# Patient Record
Sex: Male | Born: 1950 | Race: Black or African American | Hispanic: No | Marital: Married | Smoking: Former smoker
Health system: Southern US, Community
[De-identification: ages and names within clinical notes are randomized; demographics above are authoritative.]

## PROBLEM LIST (undated history)

## (undated) DIAGNOSIS — D696 Thrombocytopenia, unspecified: Secondary | ICD-10-CM

## (undated) DIAGNOSIS — I351 Nonrheumatic aortic (valve) insufficiency: Secondary | ICD-10-CM

## (undated) DIAGNOSIS — I10 Essential (primary) hypertension: Secondary | ICD-10-CM

## (undated) DIAGNOSIS — K219 Gastro-esophageal reflux disease without esophagitis: Secondary | ICD-10-CM

## (undated) HISTORY — PX: OTHER SURGICAL HISTORY: SHX169

## (undated) HISTORY — DX: Nonrheumatic aortic (valve) insufficiency: I35.1

## (undated) HISTORY — DX: Thrombocytopenia, unspecified: D69.6

## (undated) HISTORY — PX: HERNIA REPAIR: SHX51

---

## 2012-03-14 ENCOUNTER — Encounter (HOSPITAL_COMMUNITY): Payer: Self-pay | Admitting: *Deleted

## 2012-03-14 ENCOUNTER — Other Ambulatory Visit: Payer: Self-pay

## 2012-03-14 ENCOUNTER — Emergency Department (HOSPITAL_COMMUNITY): Payer: Self-pay

## 2012-03-14 ENCOUNTER — Ambulatory Visit (HOSPITAL_COMMUNITY): Admit: 2012-03-14 | Payer: Self-pay | Admitting: Cardiology

## 2012-03-14 ENCOUNTER — Inpatient Hospital Stay (HOSPITAL_COMMUNITY)
Admission: EM | Admit: 2012-03-14 | Discharge: 2012-03-15 | DRG: 313 | Disposition: A | Discharge status: Home / Self Care | Payer: 59 | Attending: Cardiology | Admitting: Cardiology

## 2012-03-14 ENCOUNTER — Encounter (HOSPITAL_COMMUNITY)
Admission: EM | Disposition: A | Discharge status: Home / Self Care | Payer: Self-pay | Source: Home / Self Care | Attending: Cardiology

## 2012-03-14 DIAGNOSIS — R002 Palpitations: Secondary | ICD-10-CM | POA: Diagnosis present

## 2012-03-14 DIAGNOSIS — R0789 Other chest pain: Principal | ICD-10-CM | POA: Diagnosis present

## 2012-03-14 DIAGNOSIS — K219 Gastro-esophageal reflux disease without esophagitis: Secondary | ICD-10-CM | POA: Diagnosis present

## 2012-03-14 DIAGNOSIS — Z7982 Long term (current) use of aspirin: Secondary | ICD-10-CM

## 2012-03-14 DIAGNOSIS — Z87891 Personal history of nicotine dependence: Secondary | ICD-10-CM

## 2012-03-14 DIAGNOSIS — Z79899 Other long term (current) drug therapy: Secondary | ICD-10-CM

## 2012-03-14 DIAGNOSIS — I10 Essential (primary) hypertension: Secondary | ICD-10-CM | POA: Diagnosis present

## 2012-03-14 DIAGNOSIS — F430 Acute stress reaction: Secondary | ICD-10-CM | POA: Diagnosis present

## 2012-03-14 DIAGNOSIS — I359 Nonrheumatic aortic valve disorder, unspecified: Secondary | ICD-10-CM | POA: Diagnosis present

## 2012-03-14 DIAGNOSIS — R079 Chest pain, unspecified: Secondary | ICD-10-CM

## 2012-03-14 HISTORY — PX: LEFT HEART CATHETERIZATION WITH CORONARY ANGIOGRAM: SHX5451

## 2012-03-14 HISTORY — DX: Gastro-esophageal reflux disease without esophagitis: K21.9

## 2012-03-14 HISTORY — DX: Essential (primary) hypertension: I10

## 2012-03-14 LAB — POCT I-STAT, CHEM 8
Glucose, Bld: 105 mg/dL — ABNORMAL HIGH (ref 70–99)
HCT: 50 % (ref 39.0–52.0)
Hemoglobin: 17 g/dL (ref 13.0–17.0)
Potassium: 4.2 mEq/L (ref 3.5–5.1)
Sodium: 141 mEq/L (ref 135–145)

## 2012-03-14 LAB — CARDIAC PANEL(CRET KIN+CKTOT+MB+TROPI): Troponin I: <0.3 ng/mL (ref <0.30)

## 2012-03-14 LAB — BASIC METABOLIC PANEL
GFR calc Af Amer: 88 mL/min — ABNORMAL LOW (ref >90)
GFR calc non Af Amer: 76 mL/min — ABNORMAL LOW (ref >90)
Potassium: 4.7 mEq/L (ref 3.5–5.1)
Sodium: 138 mEq/L (ref 135–145)

## 2012-03-14 LAB — POCT I-STAT TROPONIN I

## 2012-03-14 LAB — CBC
MCHC: 33.6 g/dL (ref 30.0–36.0)
RDW: 13.5 % (ref 11.5–15.5)

## 2012-03-14 SURGERY — LEFT HEART CATHETERIZATION WITH CORONARY ANGIOGRAM
Anesthesia: LOCAL

## 2012-03-14 MED ORDER — ASPIRIN 81 MG PO CHEW
324.0000 mg | CHEWABLE_TABLET | Freq: Once | ORAL | Status: AC
  Administered 2012-03-14: 324 mg via ORAL
  Filled 2012-03-14: qty 4

## 2012-03-14 MED ORDER — ONDANSETRON HCL 4 MG/2ML IJ SOLN: 4.0000 mg | Freq: Four times a day (QID) | INTRAMUSCULAR | Status: DC | PRN

## 2012-03-14 MED ORDER — ASPIRIN EC 81 MG PO TBEC
81.0000 mg | DELAYED_RELEASE_TABLET | Freq: Every day | ORAL | Status: DC
  Administered 2012-03-15: 81 mg via ORAL
  Filled 2012-03-14 (×3): qty 1

## 2012-03-14 MED ORDER — HEPARIN (PORCINE) IN NACL 100-0.45 UNIT/ML-% IJ SOLN
1000.0000 [IU]/h | INTRAMUSCULAR | Status: DC
  Administered 2012-03-14: 1000 [IU]/h via INTRAVENOUS
  Filled 2012-03-14: qty 250

## 2012-03-14 MED ORDER — NITROGLYCERIN 0.4 MG SL SUBL: 0.4000 mg | SUBLINGUAL_TABLET | SUBLINGUAL | Status: DC | PRN

## 2012-03-14 MED ORDER — SODIUM CHLORIDE 0.9 % IJ SOLN
3.0000 mL | Freq: Two times a day (BID) | INTRAMUSCULAR | Status: DC
  Administered 2012-03-14: 3 mL via INTRAVENOUS

## 2012-03-14 MED ORDER — HEPARIN (PORCINE) IN NACL 2-0.9 UNIT/ML-% IJ SOLN
INTRAMUSCULAR | Status: AC
  Filled 2012-03-14: qty 2000

## 2012-03-14 MED ORDER — NITROGLYCERIN 0.2 MG/ML ON CALL CATH LAB
INTRAVENOUS | Status: AC
  Filled 2012-03-14: qty 1

## 2012-03-14 MED ORDER — METOPROLOL TARTRATE 25 MG PO TABS
25.0000 mg | ORAL_TABLET | Freq: Two times a day (BID) | ORAL | Status: DC
  Administered 2012-03-14 – 2012-03-15 (×2): 25 mg via ORAL
  Filled 2012-03-14 (×2): qty 1

## 2012-03-14 MED ORDER — HEPARIN BOLUS VIA INFUSION
4000.0000 [IU] | Freq: Once | INTRAVENOUS | Status: AC
  Administered 2012-03-14: 4000 [IU] via INTRAVENOUS

## 2012-03-14 MED ORDER — SODIUM CHLORIDE 0.9 % IV SOLN: 250.0000 mL | INTRAVENOUS | Status: DC | PRN

## 2012-03-14 MED ORDER — SODIUM CHLORIDE 0.9 % IJ SOLN: 3.0000 mL | INTRAMUSCULAR | Status: DC | PRN

## 2012-03-14 MED ORDER — PANTOPRAZOLE SODIUM 40 MG PO TBEC
40.0000 mg | DELAYED_RELEASE_TABLET | Freq: Every day | ORAL | Status: DC
  Administered 2012-03-14 – 2012-03-15 (×2): 40 mg via ORAL
  Filled 2012-03-14 (×2): qty 1

## 2012-03-14 MED ORDER — LIDOCAINE HCL (PF) 1 % IJ SOLN
INTRAMUSCULAR | Status: AC
  Filled 2012-03-14: qty 30

## 2012-03-14 MED ORDER — ACETAMINOPHEN 325 MG PO TABS
650.0000 mg | ORAL_TABLET | ORAL | Status: DC | PRN
  Filled 2012-03-14: qty 2

## 2012-03-14 NOTE — ED Notes (Signed)
Patient is resting comfortably. 

## 2012-03-14 NOTE — ED Notes (Signed)
Family at bedside. 

## 2012-03-14 NOTE — H&P (Signed)
History and Physical  Patient ID: Jessy Oto Patient ID: Makani Seckman MRN: [id number], DOB/AGE: [birth year] 63 y.o. Date of Encounter: 03/14/2012  Primary Physician: No local Primary Cardiologist: none  Chief Complaint:  Chest pain  HPI: Mr. Ishmael is a 61 year old male with no clear history of coronary artery disease. He is from [place]. He speaks [place] and understands some [place]. He can speak a few words. His son-in-law's brother is here to help with translation.  Mr. Misiaszek was hospitalized in [place] [facility] (?[place] in 2012 with a cardiac issue. According to the patient there was some sort of blockage. He denies getting a heart catheterization and has never had a stent or a pacemaker. The blockage may have been in the valve. We have no other information available at this time.  Last p.m. the patient had some palpitations that were associated with chest pain. He had 3 more episodes and came to the hospital after the last one. The episodes are all the same. His heart will go "thump thump thump". He will get some pain with this. He denies shortness of breath, nausea vomiting or diaphoresis. He had an episode last p.m., an episode that woke him in the middle of the night, an episode at 8 AM and the last one about 10 AM. In the emergency room he is resting comfortably. He has had only one other episode of palpitations since he was hospitalized in [place] in 2012.  Past Medical History  Diagnosis Date  . Hypertension      Surgical History:  Past Surgical History  Procedure Date  . Hernia     inguinal and abdominal     I have reviewed the patient's current medications. Prescriptions prior to admission  Medication Sig Dispense Refill  . aspirin EC 81 MG tablet Take 81 mg by mouth daily.      . metoprolol (LOPRESSOR) 50 MG tablet Take 25 mg by mouth 2 (two) times daily.      Marland Kitchen omeprazole (PRILOSEC) 40 MG capsule Take 40 mg by mouth daily.        Allergies: No  Known Allergies  History   Social History  . Marital Status: Married    Spouse Name: [name]    Number of Children: [name]  . Years of Education: [name]   Occupational History  . Retired   Social History Main Topics  . Smoking status: Former Games developer  . Smokeless tobacco: Not on file   Comment: 1997  . Alcohol Use: Not on file  . Drug Use: Not on file  . Sexually Active: Not on file   Other Topics Concern  . Not on file   Social History Narrative   Both parents are deceased, his mother had heart problems but his father did not. No siblings have heart problems. He is a retired Nurse, learning disability.      Family history. - Mother died with heart problems  Review of Systems: He has occasional reflux symptoms and takes omeprazole for them. He denies melena. He gets a little bit of bleeding when he brushes his teeth. He has occasional musculoskeletal aches and pains. He has had no recent illnesses, fevers or chills. He has not had any recent GI symptoms. Full 14-point review of systems otherwise negative except as noted above.   Physical Exam: Blood pressure 125/84, pulse 63, temperature 98.2 F (36.8 C), temperature source Oral, resp. rate 20, SpO2 100.00%. General: Well developed, well nourished, African male in no acute distress. Head:  Normocephalic, atraumatic, sclera non-icteric, no xanthomas, nares are without discharge. Dentition: good Neck: No carotid bruits. JVD not elevated. No thyromegally Lungs: Good expansion bilaterally, Clear bilaterally to auscultation without wheezes or rhonchi. No Rales Heart: Regular rate and rhythm with S1 S2. No S3 or S4.  No murmurs, no rubs, or gallops appreciated but there is a lot of ambient noise. Abdomen: Soft, non-tender, non-distended with normoactive bowel sounds. No hepatomegaly. No rebound/guarding. No obvious abdominal masses. Msk:  Strength and tone appear normal for age. No joint deformities or effusions, no spine or costo-vertebral angle  tenderness. Extremities: No clubbing or cyanosis. No edema.  Distal pedal pulses are 2+ and equal bilaterally. Neuro: Alert and oriented X 3. Moves all extremities spontaneously. No focal deficits noted. Psych:  Responds to questions appropriately with a normal affect. Skin: No rashes or lesions noted - midline surgical scar is well healed  Labs:   Lab Results  Component Value Date   WBC 6.8 03/14/2012   HGB 17.0 03/14/2012   HCT 50.0 03/14/2012   MCV 81.8 03/14/2012   PLT 149* 03/14/2012     Lab 03/14/12 1510  NA 141  K 4.2  CL 103  CO2 --  BUN 19  CREATININE 1.20  CALCIUM --  PROT --  BILITOT --  ALKPHOS --  ALT --  AST --  GLUCOSE 105*   Lab Results  Component Value Date   POC TROPNIN 0.00    TROPONINI <0.30 03/14/2012    Radiology/Studies:  pending    EKG: sinus rhythm, early repol on recheck, not a STEMI  ASSESSMENT AND PLAN:  1. chest pain: His symptoms are atypical and associated with palpitations. He will be admitted. We will add heparin. We will check an echocardiogram. He will be continued on telemetry overnight. If his echocardiogram is without significant abnormalities and his labs including cardiac enzymes are within normal limits, consideration can be given to a Myoview  Signed,  Theodore Demark PA-C 03/14/2012, 3:26 PM   Patient seen with PA, agree with the above history and physical.  There is a language barrier: patient is from [place] and speaks only [place].  He has a history of HTN.  Patient's ECG is most likely an early repolarization pattern.  I do not think this is a STEMI.  He had very atypical chest pain (chest tightness associated with palpitations lasting for only seconds at a time).  The symptoms occurred 3 times today, each time lasting < 30 seconds.  Point of care troponin was normal.  No prior CAD, but he apparently was told by a doctor in [place] that something was wrong with a heart valve.  I cannot hear a murmur on exam (limited exam as we are in the  ER and there is considerable ambient noise).  - Cycle cardiac enzymes: if rules out, myoview in am.  - Though pain is atypical, would put him on heparin gtt for now given language barrier, making history difficult.  - Continue home metoprolol - Would try to get an echo done today to look at valves.   - Monitor on telemetry for arrhythmias.   Marca Ancona 03/14/2012 3:32 PM

## 2012-03-14 NOTE — ED Notes (Signed)
Code STEMI canceled.  

## 2012-03-14 NOTE — ED Notes (Signed)
Pt arrived to room, placed in a gown & placed on cardiac monitor, family at bedside, Dr. Marca Ancona, Cardiologist & Romeo Rabon, PA @ bedside, will continue to monitor

## 2012-03-14 NOTE — ED Notes (Signed)
3710-01 Ready 

## 2012-03-14 NOTE — ED Notes (Signed)
To ed for eval of intermittent feeling of heart racing since yesterday. No sob or cp. These palpitations have happened before last year. He was then hospitalized in [place]. States they then changed his cardiac meds. Skin w/d, resp e/u. Appears in nad

## 2012-03-14 NOTE — ED Notes (Signed)
After reviewing pt's EKG, Dr Hyman Hopes came up to triage to interview pt. Pt continues to deny cp

## 2012-03-14 NOTE — ED Provider Notes (Signed)
History     CSN: [id number]  Arrival date & time 03/14/12  1405   None     Chief Complaint  Patient presents with  . Palpitations    (Consider location/radiation/quality/duration/timing/severity/associated sxs/prior treatment) HPI  61yoM h/o CAD, HTN pw intermittent chest pain. Pt c/o several episodes of sharp chest pain last night and this morning. Describes as sharp and stabbing without radiation. Last episode over one hour ago. Each lasting seconds. Unable to tell me whether or not it is worse with exertion. Denies N/V/diaphoresis, shortness of breath. +palpitations.   ED Notes, ED Provider Notes from 03/14/12 0000 to 03/14/12 14:47:18       Janese Banks, RN 03/14/2012 14:41      After reviewing pt's EKG, Dr Hyman Hopes came up to triage to interview pt. Pt continues to deny cp         Graciela Husbands, RN 03/14/2012 14:21      To ed for eval of intermittent feeling of heart racing since yesterday. No sob or cp. These palpitations have happened before last year. He was then hospitalized in [place]. States they then changed his cardiac meds. Skin w/d, resp e/u. Appears in nad    Past Medical History  Diagnosis Date  . Hypertension     Past Surgical History  Procedure Date  . Hernia     inguinal and abdominal    History reviewed. No pertinent family history.  History  Substance Use Topics  . Smoking status: Former Games developer  . Smokeless tobacco: Not on file   Comment: 1997  . Alcohol Use: Not on file      Review of Systems  All other systems reviewed and are negative.   except as noted HPI   Allergies  Review of patient's allergies indicates no known allergies.  Home Medications   Current Outpatient Rx  Name Route Sig Dispense Refill  . ASPIRIN EC 81 MG PO TBEC Oral Take 81 mg by mouth daily.    Marland Kitchen METOPROLOL TARTRATE 50 MG PO TABS Oral Take 25 mg by mouth 2 (two) times daily.    Marland Kitchen OMEPRAZOLE 40 MG PO CPDR Oral Take 40 mg by mouth daily.      BP 125/84  Pulse 63   Temp(Src) 98.2 F (36.8 C) (Oral)  Resp 20  SpO2 100%  Physical Exam  Nursing note and vitals reviewed. Constitutional: He is oriented to person, place, and time. He appears well-developed and well-nourished. No distress.  HENT:  Head: Atraumatic.  Mouth/Throat: Oropharynx is clear and moist.  Eyes: Conjunctivae are normal. Pupils are equal, round, and reactive to light.  Neck: Neck supple.  Cardiovascular: Normal rate, regular rhythm, normal heart sounds and intact distal pulses.  Exam reveals no gallop and no friction rub.   No murmur heard. Pulmonary/Chest: Effort normal. No respiratory distress. He has no wheezes. He has no rales.  Abdominal: Soft. Bowel sounds are normal. There is no tenderness. There is no rebound and no guarding.  Musculoskeletal: Normal range of motion. He exhibits no edema and no tenderness.  Neurological: He is alert and oriented to person, place, and time.  Skin: Skin is warm and dry.  Psychiatric: He has a normal mood and affect.     Date: 03/14/2012  Rate: 61  Rhythm: sinus bradycardia  QRS Axis: normal  Intervals: normal  ST/T Wave abnormalities: STE I, aVL, V1. min ST depression and t wave inversion III  Conduction Disutrbances:none  Narrative Interpretation:   Old EKG Reviewed:  none available   ED Course  Procedures (including critical care time)  Labs Reviewed  CBC - Abnormal; Notable for the following:    Platelets 149 (*)    All other components within normal limits  BASIC METABOLIC PANEL - Abnormal; Notable for the following:    Glucose, Bld 102 (*)    GFR calc non Af Amer 76 (*)    GFR calc Af Amer 88 (*)    All other components within normal limits  POCT I-STAT, CHEM 8 - Abnormal; Notable for the following:    Glucose, Bld 105 (*)    All other components within normal limits  TROPONIN I  POCT I-STAT TROPONIN I   Dg Chest 2 View  03/14/2012  *RADIOLOGY REPORT*  Clinical Data: Ex-smoker with chest pain and palpitations.  CHEST -  2 VIEW  Comparison: None.  Findings: The heart size and mediastinal contours are normal. The lungs are clear. There is no pleural effusion or pneumothorax. No acute osseous findings are identified. A compression deformity near the thoracolumbar junction on the lateral view has a nonacute appearance.  IMPRESSION: No active cardiopulmonary process. Probable old fracture near the thoracolumbar junction.  Original Report Authenticated By: Gerrianne Scale, M.D.     1. Chest pain     MDM  Atypical chest pain with ischemic appearing EKG.   EKGs faxed to cardiology on STEMI call-- recommend 3rd EKG and acute appearing- CODE STEMI. EKG reviewed by DR. Ross  CODE STEMI activated, reversed by [facility] cardiology given atypical story.  Will be admitted to cardiology for formal cardiac evaluation.      Forbes Cellar, MD 03/16/12 [phone]

## 2012-03-14 NOTE — Progress Notes (Signed)
ANTICOAGULATION CONSULT NOTE - Initial Consult  Pharmacy Consult for Heparin Indication: USAP  No Known Allergies  Patient Measurements:    Vital Signs: Temp: 97.3 F (36.3 C) (04/08 1900) Temp src: Oral (04/08 1900) BP: 121/81 mmHg (04/08 1900) Pulse Rate: 62  (04/08 1900)  Labs:  Basename 03/14/12 1510 03/14/12 1503  HGB 17.0 15.1  HCT 50.0 45.0  PLT -- 149*  APTT -- --  LABPROT -- --  INR -- --  HEPARINUNFRC -- --  CREATININE 1.20 1.02  CKTOTAL -- --  CKMB -- --  TROPONINI -- <0.30   CrCl is unknown because there is no height on file for the current visit.  Medical History: Past Medical History  Diagnosis Date  . Hypertension   . Chest pain 03/14/2012  . GERD (gastroesophageal reflux disease)   . Diabetes mellitus     borderline diabetic controled by diet     Medications:   (Not in a hospital admission)  Assessment: 61 y/o male patient admitted with chest pain requiring anticoagulation for r/o MI. Cardiac enzymes negative x1 and EKG wnl. Plan for echo in am.  Goal of Therapy:  Heparin level 0.3-0.7 units/ml   Plan:  Heparin 4000 unit IV bolus followed by infusion at 1000 units/hr. Check 6 hour heparin level with daily cbc and heparin level.  Verlene Mayer, PharmD, BCPS Pager [phone] 03/14/2012,8:19 PM

## 2012-03-15 ENCOUNTER — Other Ambulatory Visit: Payer: Self-pay

## 2012-03-15 ENCOUNTER — Inpatient Hospital Stay (HOSPITAL_COMMUNITY): Payer: Self-pay

## 2012-03-15 ENCOUNTER — Encounter (HOSPITAL_COMMUNITY): Payer: Self-pay | Admitting: Physician Assistant

## 2012-03-15 DIAGNOSIS — R072 Precordial pain: Secondary | ICD-10-CM

## 2012-03-15 DIAGNOSIS — R079 Chest pain, unspecified: Secondary | ICD-10-CM

## 2012-03-15 LAB — CARDIAC PANEL(CRET KIN+CKTOT+MB+TROPI)
CK, MB: 2.9 ng/mL (ref 0.3–4.0)
CK, MB: 2.6 ng/mL (ref 0.3–4.0)
Relative Index: 1 (ref 0.0–2.5)
Troponin I: <0.3 ng/mL (ref <0.30)

## 2012-03-15 LAB — BASIC METABOLIC PANEL
BUN: 14 mg/dL (ref 6–23)
Chloride: 105 mEq/L (ref 96–112)
Creatinine, Ser: 1.01 mg/dL (ref 0.50–1.35)
GFR calc non Af Amer: 77 mL/min — ABNORMAL LOW (ref >90)
Glucose, Bld: 102 mg/dL — ABNORMAL HIGH (ref 70–99)
Potassium: 4.4 mEq/L (ref 3.5–5.1)

## 2012-03-15 LAB — HEPARIN LEVEL (UNFRACTIONATED)
Heparin Unfractionated: 0.93 IU/mL — ABNORMAL HIGH (ref 0.30–0.70)
Heparin Unfractionated: 0.5 IU/mL (ref 0.30–0.70)

## 2012-03-15 LAB — CBC
HCT: 44.8 % (ref 39.0–52.0)
Hemoglobin: 14.3 g/dL (ref 13.0–17.0)
MCH: 26.6 pg (ref 26.0–34.0)
MCHC: 31.9 g/dL (ref 30.0–36.0)
RDW: 13.7 % (ref 11.5–15.5)

## 2012-03-15 LAB — LIPID PANEL
Cholesterol: 166 mg/dL (ref 0–200)
HDL: 49 mg/dL (ref >39)

## 2012-03-15 MED ORDER — REGADENOSON 0.4 MG/5ML IV SOLN
0.4000 mg | Freq: Once | INTRAVENOUS | Status: AC
  Administered 2012-03-15: 0.4 mg via INTRAVENOUS
  Filled 2012-03-15: qty 5

## 2012-03-15 MED ORDER — HEPARIN (PORCINE) IN NACL 100-0.45 UNIT/ML-% IJ SOLN
850.0000 [IU]/h | INTRAMUSCULAR | Status: DC
  Filled 2012-03-15: qty 250

## 2012-03-15 MED ORDER — TECHNETIUM TC 99M TETROFOSMIN IV KIT
10.0000 | PACK | Freq: Once | INTRAVENOUS | Status: AC | PRN
  Administered 2012-03-15: 10 via INTRAVENOUS

## 2012-03-15 MED ORDER — TECHNETIUM TC 99M TETROFOSMIN IV KIT
30.0000 | PACK | Freq: Once | INTRAVENOUS | Status: AC | PRN
  Administered 2012-03-15: 30 via INTRAVENOUS

## 2012-03-15 NOTE — Progress Notes (Signed)
ANTICOAGULATION CONSULT NOTE - Follow Up  Pharmacy Consult for Heparin Indication: USAP  No Known Allergies  Patient Measurements: Height: 5\' 10"  (177.8 cm) Weight: 172 lb 2.9 oz (78.1 kg) IBW/kg (Calculated) : 73  Heparin dosing weight = 78 kg  Vital Signs: Temp: 98.6 F (37 C) (04/08 2150) Temp src: Oral (04/08 1900) BP: 147/83 mmHg (04/08 2150) Pulse Rate: 57  (04/08 2150)  Labs:  Basename 03/15/12 0443 03/14/12 2203 03/14/12 1510 03/14/12 1503  HGB 14.3 -- 17.0 --  HCT 44.8 -- 50.0 45.0  PLT 130* -- -- 149*  APTT -- -- -- --  LABPROT -- -- -- --  INR -- -- -- --  HEPARINUNFRC 0.93* -- -- --  CREATININE -- -- 1.20 1.02  CKTOTAL -- 287* -- --  CKMB -- 3.2 -- --  TROPONINI -- <0.30 -- <0.30   Estimated Creatinine Clearance: 65.1 ml/min (by C-G formula based on Cr of 1.2).  Medical History: Past Medical History  Diagnosis Date  . Hypertension   . Chest pain 03/14/2012  . GERD (gastroesophageal reflux disease)   . Diabetes mellitus     borderline diabetic controled by diet     Medications:  Prescriptions prior to admission  Medication Sig Dispense Refill  . aspirin EC 81 MG tablet Take 81 mg by mouth daily.      . metoprolol (LOPRESSOR) 50 MG tablet Take 25 mg by mouth 2 (two) times daily.      Marland Kitchen omeprazole (PRILOSEC) 40 MG capsule Take 40 mg by mouth daily.        Assessment: 61 y/o male patient admitted with chest pain requiring anticoagulation for r/o MI. Cardiac enzymes negative x1 and EKG wnl. Plan for echo in AM. Heparin level (0.93) is above-goal on 1000 units/hr. Confirmed with RN heparin level drawn from arm opposite heparin infusion.   Goal of Therapy:  Heparin level 0.3-0.7 units/ml   Plan:  1. Hold heparin x 1 hour (1610-9604).  2. Restart IV heparin at 850 units/hr at 0630.  3. Heparin level in 6 hours after restart (1230).   Lorre Munroe, PharmD. 03/15/2012,5:27 AM

## 2012-03-15 NOTE — Discharge Summary (Signed)
Discharge Summary   Patient ID: John Hayden MRN: [id number], DOB/AGE: [birth year] 61 y.o.  Primary MD: None Primary Cardiologist: New Admit date: 03/14/2012 D/C date:     03/15/2012      Primary Discharge Diagnoses:  1. Chest pain, atypical   - normal Lexiscan myoview, echo with normal EF 03/15/12 2. Mild aortic regurgitation by echo 03/15/12 3. Mildly decreased platelet count  Secondary Discharge Diagnoses:  1. HTN 2. GERD 3. Borderline DM controlled by diet  History of Present Illness: 61 y.o. male from [place] w/ HTN, but no clear h/o CAD who presented to [facility] on 03/14/12 with complaints of chest pain. Mr. Olveda was hospitalized in [place] [facility] [facility] in 2012 with a cardiac issue. According to the patient, there was some sort of blockage. He denied heart cath and has never had a stent or pacemaker. The blockage may have been in the valve, although no other info was available at time of cardiology evaluation in ER. He presented with some palpitations that were associated with chest pain where his heart will go "thump thump thump." He had 3 more episodes and came to the hospital after the last one. He only had one other episode of palpitations since hospitalization in [place] in 2012. In the ED, EKG revealed NSR with no significant ST changes. CXR was without acute cardiopulmonary abnormalities. Troponin was negative initially. He was admitted for further evaluation and treatment. His symptoms were felt atypical but in light of unclear history and language barrier he was initiated on IV heparin while he ruled out. Cardiac enzymes were cycled and remained negative. He underwent Lexiscan myoview stress testing which was normal with EF 59%. 2D echo was also obtained demonstrating EF 50-55% & mild AR. He was seen and evaluated by Dr. Johney Frame who felt he was stable for discharge home with plans for follow up as scheduled below. The patient had a borderline low platelet count  and I also conveyed these results to him and his family (his family aided in translation). He plans to return to [place] in 20 days.  Discharge Vitals: Blood pressure 128/74, pulse 49, temperature 98.4 F (36.9 C), temperature source Oral, resp. rate 20, height 5\' 10"  (1.778 m), weight 172 lb 2.9 oz (78.1 kg), SpO2 99.00%.  Labs: Lab Results  Component Value Date   WBC 5.6 03/15/2012   HGB 14.3 03/15/2012   HCT 44.8 03/15/2012   MCV 83.3 03/15/2012   PLT 130* 03/15/2012     Lab 03/15/12 0443  NA 138  K 4.4  CL 105  CO2 26  BUN 14  CREATININE 1.01  CALCIUM 8.9  PROT --  BILITOT --  ALKPHOS --  ALT --  AST --  GLUCOSE 102*    Basename 03/15/12 1325 03/15/12 0443 03/14/12 2203 03/14/12 1503  CKTOTAL 293* 289* 287* --  CKMB 2.6 2.9 3.2 --  TROPONINI <0.30 <0.30 <0.30 <0.30   Lab Results  Component Value Date   CHOL 166 03/15/2012   HDL 49 03/15/2012   LDLCALC 409* 03/15/2012   TRIG 52 03/15/2012   Studies: 03/15/12 - Lexiscan Myoview - IMPRESSION: Normal myocardial perfusion study with left ventricular ejection fraction of 59%.   03/15/12 - 2D Echocardiogram Study Conclusions - Left ventricle: The cavity size was normal. Wall thickness was normal. Systolic function was normal. The estimated ejection fraction was in the range of 50% to 55%. - Aortic valve: Mild regurgitation. - Atrial septum: No defect or patent foramen ovale  was identified.   Discharge Medications   Medication List  As of 03/15/2012  5:22 PM   TAKE these medications         aspirin EC 81 MG tablet   Take 81 mg by mouth daily.      metoprolol 50 MG tablet   Commonly known as: LOPRESSOR   Take 25 mg by mouth 2 (two) times daily.      omeprazole 40 MG capsule   Commonly known as: PRILOSEC   Take 40 mg by mouth daily.            Disposition   Discharge Orders    Future Orders Please Complete By Expires   Diet - low sodium heart healthy      Increase activity slowly        Follow-up Information     Follow up with Tereso Newcomer, PA. (Our office will call you for a follow-up appointment)    Contact information:   [address] [place] [address] [phone]       Follow up with Primary Medical Doctor. (Your blood platelet count was very mildly low. Please follow up with your primary doctor as you may need to have this rechecked to make sure it is stable.)            Duration of Discharge Encounter: Greater than 30 minutes including physician and PA time.  Signed, Ronie Spies PA-C 03/15/2012, 5:22 PM   I have seen, examined the patient, and reviewed the above assessment and plan.   Co Sign: Hillis Range, MD 03/16/2012 12:16 PM

## 2012-03-15 NOTE — Progress Notes (Signed)
  Echocardiogram 2D Echocardiogram has been performed.  John Hayden A 03/15/2012, 2:31 PM

## 2012-03-15 NOTE — Progress Notes (Signed)
Pts family at bedside and reviewed d/c instructions with him who spoke [place]. Assessment unchanged from this am and pt discharged to private vehicle via wheelchair

## 2012-03-15 NOTE — Progress Notes (Signed)
Cardiology Progress Note Patient Name: John Hayden Date of Encounter: 03/15/2012, 10:10 AM     Subjective  Patient seen briefly in nuclear stress room.   Pre Test: VSS. EKG shows SB 53bpm, no acute ST/T changes. Pt denies chest pain or sob.  During Test: VSS. EKG with ST flattening inferolaterally, TWI V3-4. Pt denies chest pain or sob.  Post Test: VSS. EKG returned to baseline. Pt without complaints.  Await interpretation of images.    Objective   Telemetry: Unable to assess as pt was seen in nuc med  Medications: . aspirin  324 mg Oral Once  . aspirin EC  81 mg Oral Daily  . heparin  4,000 Units Intravenous Once  . metoprolol  25 mg Oral BID  . pantoprazole  40 mg Oral Q1200  . sodium chloride  3 mL Intravenous Q12H   . heparin 850 Units/hr (03/15/12 0636)    Physical Exam: Temp:  [97.3 F (36.3 C)-98.6 F (37 C)] 98.1 F (36.7 C) (04/09 0600) Pulse Rate:  [54-63] 54  (04/09 0600) Resp:  [19-20] 20  (04/09 0600) BP: (121-147)/(81-88) 143/88 mmHg (04/09 0600) SpO2:  [99 %-100 %] 99 % (04/09 0600) Weight:  [169 lb 12.1 oz (77.001 kg)-172 lb 2.9 oz (78.1 kg)] 172 lb 2.9 oz (78.1 kg) (04/08 2150)  General: Pleasant black male, in no acute distress. Head: Normocephalic, atraumatic, sclera non-icteric, nares are without discharge.  Neck: Supple. Negative for carotid bruits or JVD Lungs: Clear bilaterally to auscultation without wheezes, rales, or rhonchi. Breathing is unlabored. Heart: RRR S1 S2 without murmurs, rubs, or gallops.  Abdomen: Soft, non-tender, non-distended with normoactive bowel sounds. No rebound/guarding. No obvious abdominal masses. Msk:  Strength and tone appear normal for age. Extremities: Trace bilat ankle edema. No clubbing or cyanosis. Distal pedal pulses are intact and equal bilaterally. Neuro: Alert and oriented X 3. Moves all extremities spontaneously. Psych:  Responds to questions appropriately with a normal  affect.   Intake/Output Summary (Last 24 hours) at 03/15/12 1010 Last data filed at 03/15/12 0900  Gross per 24 hour  Intake      0 ml  Output      0 ml  Net      0 ml    Labs:  Basename 03/15/12 0443 03/14/12 1510 03/14/12 1503  NA 138 141 --  K 4.4 4.2 --  CL 105 103 --  CO2 26 -- 25  GLUCOSE 102* 105* --  BUN 14 19 --  CREATININE 1.01 1.20 --  CALCIUM 8.9 -- 9.5   Basename 03/15/12 0443 03/14/12 1510 03/14/12 1503  WBC 5.6 -- 6.8  HGB 14.3 17.0 --  HCT 44.8 50.0 --  MCV 83.3 -- 81.8  PLT 130* -- 149*   Basename 03/15/12 0443 03/14/12 2203 03/14/12 1503  CKTOTAL 289* 287* --  CKMB 2.9 3.2 --  TROPONINI <0.30 <0.30 <0.30   Basename 03/15/12 0443  CHOL 166  HDL 49  LDLCALC 107*  TRIG 52  CHOLHDL 3.4    Radiology/Studies:    03/14/2012 - CXR  Findings: The heart size and mediastinal contours are normal. The lungs are clear. There is no pleural effusion or pneumothorax. No acute osseous findings are identified. A compression deformity near the thoracolumbar junction on the lateral view has a nonacute appearance.  IMPRESSION: No active cardiopulmonary process. Probable old fracture near the thoracolumbar junction.     Assessment and Plan  61 y.o. male from [place] w/ HTN, but  no clear h/o CAD who presented to [facility] on 03/14/12 with complaints of chest pain.  1. Chest Pain: Atypical symptoms. EKG nonacute. Cardiac enzymes negative x3 (elevated total CK). Echo pending. For myoview this morning. Cont Heparin, ASA, BB. Further plans pending myoview results. LDL 107, consider addition of statin if ischemia on myoview.  Signed, HOPE, JESSICA PA-C   I have seen, examined the patient, and reviewed the above assessment and plan.  Pt without chest pain today.  Exam as above.  I would anticipate discharge later today if myoview is low risk.  Stop heparin gtt.  If myoview is high risk, would consider cath in am.  Otherwise, echo can be done in the office.  He should  follow-up with Tereso Newcomer in 2 weeks.   Co Sign: Hillis Range, MD 03/15/2012 3:55 PM

## 2012-03-15 NOTE — Progress Notes (Signed)
ANTICOAGULATION CONSULT NOTE - Follow Up  Pharmacy Consult for Heparin Indication: USAP  No Known Allergies  Patient Measurements: Height: 5\' 10"  (177.8 cm) Weight: 172 lb 2.9 oz (78.1 kg) IBW/kg (Calculated) : 73  Heparin dosing weight = 78 kg  Vital Signs: Temp: 98.1 F (36.7 C) (04/09 0600) BP: 127/80 mmHg (04/09 1046) Pulse Rate: 76  (04/09 1046)  Labs:  Basename 03/15/12 1325 03/15/12 0443 03/14/12 2203 03/14/12 1510 03/14/12 1503  HGB -- 14.3 -- 17.0 --  HCT -- 44.8 -- 50.0 45.0  PLT -- 130* -- -- 149*  APTT -- -- -- -- --  LABPROT -- -- -- -- --  INR -- -- -- -- --  HEPARINUNFRC 0.50 0.93* -- -- --  CREATININE -- 1.01 -- 1.20 1.02  CKTOTAL -- 289* 287* -- --  CKMB -- 2.9 3.2 -- --  TROPONINI -- <0.30 <0.30 -- <0.30   Estimated Creatinine Clearance: 77.3 ml/min (by C-G formula based on Cr of 1.01).  Medical History: Past Medical History  Diagnosis Date  . Hypertension   . Chest pain 03/14/2012  . GERD (gastroesophageal reflux disease)   . Diabetes mellitus     borderline diabetic controled by diet     Medications:  Prescriptions prior to admission  Medication Sig Dispense Refill  . aspirin EC 81 MG tablet Take 81 mg by mouth daily.      . metoprolol (LOPRESSOR) 50 MG tablet Take 25 mg by mouth 2 (two) times daily.      Marland Kitchen omeprazole (PRILOSEC) 40 MG capsule Take 40 mg by mouth daily.        Assessment: Heparin level now therapeutic at 0.50 Myoview images pending.   Goal of Therapy:  Heparin level 0.3-0.7 units/ml   Plan:  1. Continue heparin at 850 units /hr 2. Follow up AM heparin level  Thank you.  Okey Regal, PharmD Clinical Pharmacist [phone] 03/15/2012,2:15 PM

## 2012-03-15 NOTE — Progress Notes (Signed)
Clinical Social Work Department BRIEF PSYCHOSOCIAL ASSESSMENT 03/15/2012  Patient:  DWIGHT, ADAMCZAK     Account Number:  [id number]     Admit date:  03/14/2012  Clinical Social Worker:  Hulan Fray  Date/Time:  03/15/2012 12:53 PM  Referred by:  RN  Date Referred:  03/15/2012 Referred for  Other - See comment   Other Referral:   Financial assistance wih hospital bill   Interview type:  Family Other interview type:    PSYCHOSOCIAL DATA Living Status:  WIFE Admitted from facility:   Level of care:   Primary support name:  Lradi Primary support relationship to patient:  CHILD, ADULT Degree of support available:   supportive    CURRENT CONCERNS Current Concerns  Financial Resources   Other Concerns:    SOCIAL WORK ASSESSMENT / PLAN Clinical Social Worker received call from RN that this patient is requesting assistance with hospital bill. Patient does not speak english that well, but daughter was in room and was able to voice current concern of medical bill. CSW offered to call financial counselor to assist with hospital bill. CSW called financial counselor, Maddie to assist with hospital bill. CSW informed daughter that the financial counselor can assist with medicaid application, but daughter declined and stated that patient is from out of town and visiting her. CSW will sign off as social work intervention is no longer needed.   Assessment/plan status:  No Further Intervention Required Other assessment/ plan:   Information/referral to community resources:    PATIENT'S/FAMILY'S RESPONSE TO PLAN OF CARE: Family is agreeable to CSW contacting financial counselor for assistance with hospital bill.

## 2012-03-30 ENCOUNTER — Ambulatory Visit (INDEPENDENT_AMBULATORY_CARE_PROVIDER_SITE_OTHER): Payer: Self-pay | Admitting: Physician Assistant

## 2012-03-30 ENCOUNTER — Encounter: Payer: Self-pay | Admitting: Physician Assistant

## 2012-03-30 VITALS — BP 170/100 | HR 65 | Ht 62.0 in | Wt 179.8 lb

## 2012-03-30 DIAGNOSIS — I10 Essential (primary) hypertension: Secondary | ICD-10-CM | POA: Insufficient documentation

## 2012-03-30 DIAGNOSIS — R079 Chest pain, unspecified: Secondary | ICD-10-CM

## 2012-03-30 DIAGNOSIS — I359 Nonrheumatic aortic valve disorder, unspecified: Secondary | ICD-10-CM

## 2012-03-30 DIAGNOSIS — D696 Thrombocytopenia, unspecified: Secondary | ICD-10-CM

## 2012-03-30 DIAGNOSIS — I351 Nonrheumatic aortic (valve) insufficiency: Secondary | ICD-10-CM | POA: Insufficient documentation

## 2012-03-30 MED ORDER — METOPROLOL TARTRATE 50 MG PO TABS: 25.0000 mg | ORAL_TABLET | Freq: Two times a day (BID) | ORAL | Status: DC

## 2012-03-30 NOTE — Progress Notes (Signed)
[address] [place], [place]  [address] Phone: [phone] Fax:  [phone]  Date:  03/30/2012   Name:  John Hayden       DOB:  Jan 17, 1951 MRN:  [id number]  PCP:  Pcp Not In System  Primary Cardiologist:  Dr. Marca Ancona  Primary Electrophysiologist:  None    History of Present Illness: John Hayden is a 61 y.o. male from [place] (speaks only [place]) who presents for post hospital follow up.  He has a h/o HTN and GERD as well as borderline DM.  He reported a h/o admission to [facility], [place], [place] in 2012.  He apparently had some testing on his heart but no apparent LHC or PCI.  He was admitted with CP and palpitations.  He ruled out of MI.  Echo normal aside from mild AI.  Stress test also was normal.  He was asked to follow up with his PCP for low Plt count.  He was set up for follow up with me today.  He is apparently returning to [place] soon.  2D Echocardiogram 03/15/12: Study Conclusions  - Left ventricle: The cavity size was normal. Wall thickness was normal. Systolic function was normal. The estimated ejection fraction was in the range of 50% to 55%. - Aortic valve: Mild regurgitation. - Atrial septum: No defect or patent foramen ovale was identified.  Lexiscan Myoview 03/14/12: IMPRESSION: Normal myocardial perfusion study with left ventricular ejection fraction of 59%.  Here with daughter who interprets.  Patient denies chest pain, dyspnea, syncope, palpitations, edema, headaches.  Forgot to take his medication this AM.  Leaves for [place] next week.  Will run out of metoprolol before he goes back.    Past Medical History  Diagnosis Date  . Hypertension   . GERD (gastroesophageal reflux disease)   . Diabetes mellitus     borderline diabetic controlled by diet   . Chest pain     echo 4/13: EF 50-55%, mild AI;  Myoview 4/13: EF 59%, no ischemia  . Mild aortic insufficiency   . Thrombocytopenia     Current Outpatient  Prescriptions  Medication Sig Dispense Refill  . aspirin EC 81 MG tablet Take 81 mg by mouth daily.      . metoprolol (LOPRESSOR) 50 MG tablet Take 25 mg by mouth 2 (two) times daily.      Marland Kitchen omeprazole (PRILOSEC) 40 MG capsule Take 40 mg by mouth daily.        Allergies: No Known Allergies  History  Substance Use Topics  . Smoking status: Former Games developer  . Smokeless tobacco: Never Used   Comment: 1997  . Alcohol Use: No     PHYSICAL EXAM: VS:  BP 170/100  Pulse 65  Ht 5\' 2"  (1.575 m)  Wt 179 lb 12.8 oz (81.557 kg)  BMI 32.89 kg/m2 Repeat BP by me 142/92  Well nourished, well developed, in no acute distress HEENT: normal Neck: no JVD Cardiac:  normal S1, S2; RRR; no murmur Lungs:  clear to auscultation bilaterally, no wheezing, rhonchi or rales Abd: soft, nontender, no hepatomegaly Ext: no edema Skin: warm and dry Neuro:  CNs 2-12 intact, no focal abnormalities noted  EKG:  Sinus rhythm, heart rate 65, normal axis, J-point elevation, LVH, no change from prior  ASSESSMENT AND PLAN:  1. Essential hypertension, benign  Uncontrolled.  He did not take his medication this am.  He took in the office an BP came down some.  He had very well  controlled BP in the hospital on his current regimen.  No changes.  Refill metoprolol for him.     2. Chest pain  Resolved.  Atypical.  Myoview and Echo ok.  Follow up PRN   3. Mild AI (aortic insufficiency)  Provided him with copies of all hospital records.  He knows to follow up with his physician and [place].  He would likely need follow up echo in 2 years.   4. Thrombocytopenia  Sounds like this is chronic.  Again, gave all his records to him.  He will follow up on this with his PCP in [place].       Signed, Tereso Newcomer, PA-C  10:35 AM 03/30/2012

## 2012-03-30 NOTE — Patient Instructions (Signed)
Your physician recommends that you schedule a follow-up appointment in: AS NEEDED  A REFILL HAS BEEN SENT IN TODAY FOR METOPROLOL

## 2014-11-15 ENCOUNTER — Encounter (HOSPITAL_COMMUNITY): Payer: Self-pay | Admitting: Cardiology

## 2016-02-10 ENCOUNTER — Emergency Department (HOSPITAL_COMMUNITY)
Admission: EM | Admit: 2016-02-10 | Discharge: 2016-02-10 | Disposition: A | Discharge status: Home / Self Care | Payer: Self-pay | Attending: Emergency Medicine | Admitting: Emergency Medicine

## 2016-02-10 ENCOUNTER — Encounter (HOSPITAL_COMMUNITY): Payer: Self-pay

## 2016-02-10 DIAGNOSIS — I10 Essential (primary) hypertension: Secondary | ICD-10-CM | POA: Insufficient documentation

## 2016-02-10 DIAGNOSIS — R1030 Lower abdominal pain, unspecified: Secondary | ICD-10-CM | POA: Insufficient documentation

## 2016-02-10 DIAGNOSIS — Z79899 Other long term (current) drug therapy: Secondary | ICD-10-CM | POA: Insufficient documentation

## 2016-02-10 DIAGNOSIS — Z7984 Long term (current) use of oral hypoglycemic drugs: Secondary | ICD-10-CM | POA: Insufficient documentation

## 2016-02-10 DIAGNOSIS — Z862 Personal history of diseases of the blood and blood-forming organs and certain disorders involving the immune mechanism: Secondary | ICD-10-CM | POA: Insufficient documentation

## 2016-02-10 DIAGNOSIS — K219 Gastro-esophageal reflux disease without esophagitis: Secondary | ICD-10-CM | POA: Insufficient documentation

## 2016-02-10 DIAGNOSIS — R350 Frequency of micturition: Secondary | ICD-10-CM | POA: Insufficient documentation

## 2016-02-10 DIAGNOSIS — Z87891 Personal history of nicotine dependence: Secondary | ICD-10-CM | POA: Insufficient documentation

## 2016-02-10 DIAGNOSIS — Z7982 Long term (current) use of aspirin: Secondary | ICD-10-CM | POA: Insufficient documentation

## 2016-02-10 LAB — URINALYSIS, ROUTINE W REFLEX MICROSCOPIC
Bilirubin Urine: NEGATIVE
Glucose, UA: NEGATIVE mg/dL
Hgb urine dipstick: NEGATIVE
KETONES UR: NEGATIVE mg/dL
LEUKOCYTES UA: NEGATIVE
NITRITE: NEGATIVE
PH: 5 (ref 5.0–8.0)
PROTEIN: NEGATIVE mg/dL
Specific Gravity, Urine: 1.009 (ref 1.005–1.030)

## 2016-02-10 LAB — CBC
HCT: 41.6 % (ref 39.0–52.0)
Hemoglobin: 13.5 g/dL (ref 13.0–17.0)
MCH: 26.6 pg (ref 26.0–34.0)
MCHC: 32.5 g/dL (ref 30.0–36.0)
MCV: 81.9 fL (ref 78.0–100.0)
PLATELETS: 123 10*3/uL — AB (ref 150–400)
RBC: 5.08 MIL/uL (ref 4.22–5.81)
RDW: 13.1 % (ref 11.5–15.5)
WBC: 4.8 10*3/uL (ref 4.0–10.5)

## 2016-02-10 LAB — COMPREHENSIVE METABOLIC PANEL
ALBUMIN: 3.6 g/dL (ref 3.5–5.0)
ALT: 21 U/L (ref 17–63)
ANION GAP: 8 (ref 5–15)
AST: 23 U/L (ref 15–41)
Alkaline Phosphatase: 37 U/L — ABNORMAL LOW (ref 38–126)
BUN: 17 mg/dL (ref 6–20)
CHLORIDE: 104 mmol/L (ref 101–111)
CO2: 30 mmol/L (ref 22–32)
Calcium: 9.6 mg/dL (ref 8.9–10.3)
Creatinine, Ser: 1.17 mg/dL (ref 0.61–1.24)
GFR calc Af Amer: >60 mL/min (ref >60)
GFR calc non Af Amer: >60 mL/min (ref >60)
GLUCOSE: 101 mg/dL — AB (ref 65–99)
POTASSIUM: 3.8 mmol/L (ref 3.5–5.1)
SODIUM: 142 mmol/L (ref 135–145)
Total Bilirubin: 0.4 mg/dL (ref 0.3–1.2)
Total Protein: 6 g/dL — ABNORMAL LOW (ref 6.5–8.1)

## 2016-02-10 LAB — CBG MONITORING, ED: GLUCOSE-CAPILLARY: 183 mg/dL — AB (ref 65–99)

## 2016-02-10 MED ORDER — DOXYCYCLINE HYCLATE 100 MG PO CAPS: 100.0000 mg | ORAL_CAPSULE | Freq: Two times a day (BID) | ORAL | Status: DC

## 2016-02-10 MED ORDER — CEFTRIAXONE SODIUM 250 MG IJ SOLR
250.0000 mg | Freq: Once | INTRAMUSCULAR | Status: AC
  Administered 2016-02-10: 250 mg via INTRAMUSCULAR
  Filled 2016-02-10: qty 250

## 2016-02-10 MED ORDER — CIPROFLOXACIN HCL 500 MG PO TABS: 500.0000 mg | ORAL_TABLET | Freq: Two times a day (BID) | ORAL | Status: DC

## 2016-02-10 NOTE — ED Notes (Signed)
Pt and family verbalize understanding of instructions. 

## 2016-02-10 NOTE — ED Provider Notes (Signed)
CSN: [id number]     Arrival date & time 02/10/16  3086 History   First MD Initiated Contact with Patient 02/10/16 1210     Chief Complaint  Patient presents with  . Abdominal Pain  . Urinary Frequency   HPI   John Hayden is a 65 y.o. male PMH significant for Hypertension, diabetes presenting with a 3 day history of suprapubic pain and "prostate pain". He endorses urinary frequency and difficulty stopping urine flow once it begins. He denies fevers, chills, chest pain, shortness of breath, nausea, vomiting, penile discharge or pain, hematochezia, history of this previously. Patient's daughter is at bedside and is interpreting for patient, as he speaks [place].  Past Medical History  Diagnosis Date  . Hypertension   . GERD (gastroesophageal reflux disease)   . Diabetes mellitus     borderline diabetic controlled by diet   . Chest pain     echo 4/13: EF 50-55%, mild AI;  Myoview 4/13: EF 59%, no ischemia  . Mild aortic insufficiency   . Thrombocytopenia [facility])    Past Surgical History  Procedure Laterality Date  . Hernia      inguinal and abdominal  . Hernia repair    . Left heart catheterization with coronary angiogram [name] 03/14/2012    Procedure: LEFT HEART CATHETERIZATION WITH CORONARY ANGIOGRAM;  Surgeon: Peter M Swaziland, MD;  Location: [facility] CATH LAB;  Service: Cardiovascular;  Laterality: [name];   No family history on file. Social History  Substance Use Topics  . Smoking status: Former Games developer  . Smokeless tobacco: Never Used     Comment: 1997  . Alcohol Use: No    Review of Systems  Ten systems are reviewed and are negative for acute change except as noted in the HPI  Allergies  Review of patient's allergies indicates no known allergies.  Home Medications   Prior to Admission medications   Medication Sig Start Date End Date Taking? Authorizing Provider  aspirin EC 81 MG tablet Take 81 mg by mouth daily.   Yes Historical Provider, MD  metFORMIN (GLUCOPHAGE) 500 MG  tablet Take 500 mg by mouth 2 (two) times daily with a meal.   Yes Historical Provider, MD  PRESCRIPTION MEDICATION Take 4 mg by mouth daily. 1 tablet once daily. ( cardox 4 mg. ( for prostate)   Yes Historical Provider, MD  PRESCRIPTION MEDICATION Take 1 tablet by mouth daily. Atenolol/chlortalidone 50 mg/ 12.5 mg ( tenoric-50)   Yes Historical Provider, MD  metoprolol (LOPRESSOR) 50 MG tablet Take 0.5 tablets (25 mg total) by mouth 2 (two) times daily. 03/30/12   Beatrice Lecher, PA-C  omeprazole (PRILOSEC) 40 MG capsule Take 40 mg by mouth daily.    Historical Provider, MD   BP 126/77 mmHg  Pulse 45  Temp(Src) 98.1 F (36.7 C) (Oral)  Resp 16  SpO2 100% Physical Exam  Constitutional: He appears well-developed and well-nourished. No distress.  HENT:  Head: Normocephalic and atraumatic.  Mouth/Throat: Oropharynx is clear and moist. No oropharyngeal exudate.  Eyes: Conjunctivae are normal. Pupils are equal, round, and reactive to light. Right eye exhibits no discharge. Left eye exhibits no discharge. No scleral icterus.  Neck: No tracheal deviation present.  Cardiovascular: Normal rate, regular rhythm, normal heart sounds and intact distal pulses.  Exam reveals no gallop and no friction rub.   No murmur heard. Pulmonary/Chest: Effort normal and breath sounds normal. No respiratory distress. He has no wheezes. He has no rales. He exhibits no tenderness.  Abdominal:  Soft. Bowel sounds are normal. He exhibits no distension and no mass. There is no tenderness. There is no rebound and no guarding.  Genitourinary: Rectum normal, prostate normal and penis normal. No penile tenderness.  DRE with chaperone: negative without mass, lesions or tenderness.   Musculoskeletal: He exhibits no edema.  Lymphadenopathy:    He has no cervical adenopathy.  Neurological: He is alert. Coordination normal.  Skin: Skin is warm and dry. No rash noted. He is not diaphoretic. No erythema.  Psychiatric: He has a  normal mood and affect. His behavior is normal.  Nursing note and vitals reviewed.   ED Course  Procedures  Labs Review Labs Reviewed  CBC - Abnormal; Notable for the following:    Platelets 123 (*)    All other components within normal limits  COMPREHENSIVE METABOLIC PANEL - Abnormal; Notable for the following:    Glucose, Bld 101 (*)    Total Protein 6.0 (*)    Alkaline Phosphatase 37 (*)    All other components within normal limits  CBG MONITORING, ED - Abnormal; Notable for the following:    Glucose-Capillary 183 (*)    All other components within normal limits  URINALYSIS, ROUTINE W REFLEX MICROSCOPIC (NOT AT [facility])  GC/CHLAMYDIA PROBE AMP ([facility]) NOT AT [facility]    MDM   Final diagnoses:  Suprapubic pain, unspecified laterality  Urinary frequency   Patient nontoxic appearing, VSS. Most likely etiologies are prostatitis vs BPH. UA, CMP, CBC unremarkable. Will empirically treat prostatitis. Patient referred to urology. Patient may be safely discharged home. Discussed reasons for return. Patient to follow-up with primary care provider and urology. Patient in understanding and agreement with the plan.   Melton KrebsSamantha Nicole Piedad Standiford, PA-C 02/13/16 [id number]  Tilden FossaElizabeth Rees, MD 02/14/16 1010

## 2016-02-10 NOTE — Discharge Instructions (Signed)
Ms. Jessy OtoWassama Burdin,  Nice meeting you! Please follow-up with urology. Return to the emergency department if you develop fevers, chills, increased abdominal pain. Feel better soon!  S. Lane HackerNicole Kerline Trahan, PA-C

## 2016-02-10 NOTE — ED Notes (Signed)
Assumed care of pt. Pt c/o suprapubic pain, and states his "prostate hurts." Pt also endorses urinary frequency. Pt denies penile discharge. Pt's daughter at bedside visiting with pt.

## 2016-02-10 NOTE — ED Notes (Signed)
Patient here with lower abdominal pain with frequent urination for several days. Patient also diabetic but does not check BS. No nausea, no vomiting

## 2016-02-11 LAB — GC/CHLAMYDIA PROBE AMP (~~LOC~~) NOT AT ARMC
CHLAMYDIA, DNA PROBE: NEGATIVE
NEISSERIA GONORRHEA: NEGATIVE

## 2016-02-24 ENCOUNTER — Ambulatory Visit (INDEPENDENT_AMBULATORY_CARE_PROVIDER_SITE_OTHER): Payer: No Typology Code available for payment source | Admitting: Family Medicine

## 2016-02-24 ENCOUNTER — Encounter: Payer: Self-pay | Admitting: Family Medicine

## 2016-02-24 VITALS — BP 157/59 | HR 57 | Temp 97.6°F | Ht 64.0 in | Wt 174.0 lb

## 2016-02-24 DIAGNOSIS — E138 Other specified diabetes mellitus with unspecified complications: Secondary | ICD-10-CM

## 2016-02-24 DIAGNOSIS — Z1211 Encounter for screening for malignant neoplasm of colon: Secondary | ICD-10-CM

## 2016-02-24 DIAGNOSIS — Z23 Encounter for immunization: Secondary | ICD-10-CM

## 2016-02-24 LAB — CBC WITH DIFFERENTIAL/PLATELET
Basophils Absolute: 0 10*3/uL (ref 0.0–0.1)
Basophils Relative: 0 % (ref 0–1)
EOS PCT: 1 % (ref 0–5)
Eosinophils Absolute: 0.1 10*3/uL (ref 0.0–0.7)
HEMATOCRIT: 44.9 % (ref 39.0–52.0)
Hemoglobin: 14.6 g/dL (ref 13.0–17.0)
LYMPHS ABS: 2.2 10*3/uL (ref 0.7–4.0)
LYMPHS PCT: 44 % (ref 12–46)
MCH: 26.6 pg (ref 26.0–34.0)
MCHC: 32.5 g/dL (ref 30.0–36.0)
MCV: 81.9 fL (ref 78.0–100.0)
MONO ABS: 0.4 10*3/uL (ref 0.1–1.0)
MONOS PCT: 8 % (ref 3–12)
MPV: 11.3 fL (ref 8.6–12.4)
Neutro Abs: 2.4 10*3/uL (ref 1.7–7.7)
Neutrophils Relative %: 47 % (ref 43–77)
Platelets: 145 10*3/uL — ABNORMAL LOW (ref 150–400)
RBC: 5.48 MIL/uL (ref 4.22–5.81)
RDW: 13.5 % (ref 11.5–15.5)
WBC: 5.1 10*3/uL (ref 4.0–10.5)

## 2016-02-24 LAB — COMPLETE METABOLIC PANEL WITH GFR
ALT: 31 U/L (ref 9–46)
AST: 23 U/L (ref 10–35)
Albumin: 4.4 g/dL (ref 3.6–5.1)
Alkaline Phosphatase: 42 U/L (ref 40–115)
BILIRUBIN TOTAL: 0.4 mg/dL (ref 0.2–1.2)
BUN: 17 mg/dL (ref 7–25)
CHLORIDE: 102 mmol/L (ref 98–110)
CO2: 28 mmol/L (ref 20–31)
CREATININE: 1.18 mg/dL (ref 0.70–1.25)
Calcium: 9.6 mg/dL (ref 8.6–10.3)
GFR, Est African American: 73 mL/min (ref >=60)
GFR, Est Non African American: 63 mL/min (ref >=60)
Glucose, Bld: 111 mg/dL — ABNORMAL HIGH (ref 65–99)
Potassium: 3.9 mmol/L (ref 3.5–5.3)
Sodium: 140 mmol/L (ref 135–146)
TOTAL PROTEIN: 7.1 g/dL (ref 6.1–8.1)

## 2016-02-24 LAB — LIPID PANEL
Cholesterol: 178 mg/dL (ref 125–200)
HDL: 48 mg/dL (ref >=40)
LDL Cholesterol: 108 mg/dL (ref <130)
Total CHOL/HDL Ratio: 3.7 Ratio (ref <=5.0)
Triglycerides: 108 mg/dL (ref <150)
VLDL: 22 mg/dL (ref <30)

## 2016-02-24 LAB — HEMOGLOBIN A1C
HEMOGLOBIN A1C: 7.1 % — AB (ref <5.7)
Mean Plasma Glucose: 157 mg/dL — ABNORMAL HIGH (ref <117)

## 2016-02-24 NOTE — Patient Instructions (Signed)
I will send in prescriptions tomorrow when I get the bloodwork back Watch sweets and other carbohydrates in your diet. Try to exercise 150 minutes a week.  Come back in 3 months for recheck of diabetes

## 2016-02-24 NOTE — Progress Notes (Signed)
Patient ID: John Hayden, male   DOB: 12-24-1950, 65 y.o.   MRN: [id number]   John Hayden, is a 65 y.o. male  WUJ:[id number]  [id number]  DOB - 1950-12-21  CC:  Chief Complaint  Patient presents with  . new patient/get established    unsure if he is having reaction to one of the meds he is taking, he complains of recent itching and stopped all meds, he needs refills if you want him to continue the meds       HPI: John Hayden is a 65 y.o. male here to establish care. (Late Note) He has a history of hypertension, GERD, diabetes, aortic insufficiency, thrombocytopenia.  He was recently seen in ED with urinary symptoms. He was diagnoses with a presumptive prostatitis and treat with Doxycycline. ED physician recommended he see urologist. A culture done at that time was negative. His DRE was negative.   No Known Allergies Past Medical History  Diagnosis Date  . Hypertension   . GERD (gastroesophageal reflux disease)   . Diabetes mellitus     borderline diabetic controlled by diet   . Chest pain     echo 4/13: EF 50-55%, mild AI;  Myoview 4/13: EF 59%, no ischemia  . Mild aortic insufficiency   . Thrombocytopenia (HCC)    Current Outpatient Prescriptions on File Prior to Visit  Medication Sig Dispense Refill  . aspirin EC 81 MG tablet Take 81 mg by mouth daily. Reported on 02/24/2016    . doxycycline (VIBRAMYCIN) 100 MG capsule Take 1 capsule (100 mg total) by mouth 2 (two) times daily. (Patient not taking: Reported on 02/24/2016) 28 capsule 0  . metFORMIN (GLUCOPHAGE) 500 MG tablet Take 500 mg by mouth 2 (two) times daily with a meal. Reported on 02/24/2016    . omeprazole (PRILOSEC) 40 MG capsule Take 40 mg by mouth daily. Reported on 02/24/2016    . PRESCRIPTION MEDICATION Take 4 mg by mouth daily. 1 tablet once daily. ( cardox 4 mg. ( for prostate)    . PRESCRIPTION MEDICATION Take 1 tablet by mouth daily. Atenolol/chlortalidone 50 mg/ 12.5 mg ( tenoric-50)     No current  facility-administered medications on file prior to visit.   History reviewed. No pertinent family history. Social History   Social History  . Marital Status: Married    Spouse Name: [name]  . Number of Children: [name]  . Years of Education: [name]   Occupational History  . Not on file.   Social History Main Topics  . Smoking status: Former Games developer  . Smokeless tobacco: Never Used     Comment: 1997  . Alcohol Use: No  . Drug Use: No  . Sexual Activity: Not Currently   Other Topics Concern  . Not on file   Social History Narrative   Both parents are deceased, his mother had heart problems but his father did not. No siblings have heart problems. He is a retired Nurse, learning disability.    Review of Systems: Constitutional: Negative for fever, chills, appetite change, weight loss,  Fatigue. Skin: Negative for rashes or lesions of concern. HENT: Negative for ear pain, ear discharge.nose bleeds Eyes: Negative for pain, discharge, redness, itching and visual disturbance. Neck: Negative for pain, stiffness Respiratory: Negative for cough, shortness of breath,   Cardiovascular: Negative for chest pain, palpitations and leg swelling. Gastrointestinal: Negative for abdominal pain, nausea, vomiting, diarrhea, constipations Genitourinary: Negative for dysuria, urgency, frequency, hematuria,  Musculoskeletal: Negative for back pain, joint pain, joint  swelling, and  gait problem.Negative for weakness. Neurological: Negative for dizziness, tremors, seizures, syncope,   light-headedness, numbness and headaches.  Hematological: Negative for easy bruising or bleeding Psychiatric/Behavioral: Negative for depression, anxiety, decreased concentration, confusion   Objective:   Filed Vitals:   02/24/16 1333  BP: 157/59  Pulse: 57  Temp: 97.6 F (36.4 C)    Physical Exam: Constitutional: Patient appears well-developed and well-nourished. No distress. HENT: Normocephalic, atraumatic, External right and  left ear normal. Oropharynx is clear and moist.  Eyes: Conjunctivae and EOM are normal. PERRLA, no scleral icterus. Neck: Normal ROM. Neck supple. No lymphadenopathy, No thyromegaly. CVS: RRR, S1/S2 +, no murmurs, no gallops, no rubs Pulmonary: Effort and breath sounds normal, no stridor, rhonchi, wheezes, rales.  Abdominal: Soft. Normoactive BS,, no distension, tenderness, rebound or guarding.  Musculoskeletal: Normal range of motion. No edema and no tenderness.  Neuro: Alert.Normal muscle tone coordination. Non-focal Skin: Skin is warm and dry. No rash noted. Not diaphoretic. No erythema. No pallor. Psychiatric: Normal mood and affect. Behavior, judgment, thought content normal.  Lab Results  Component Value Date   WBC 4.8 02/10/2016   HGB 13.5 02/10/2016   HCT 41.6 02/10/2016   MCV 81.9 02/10/2016   PLT 123* 02/10/2016   Lab Results  Component Value Date   CREATININE 1.17 02/10/2016   BUN 17 02/10/2016   NA 142 02/10/2016   K 3.8 02/10/2016   CL 104 02/10/2016   CO2 30 02/10/2016    No results found for: HGBA1C Lipid Panel     Component Value Date/Time   CHOL 166 03/15/2012 0443   TRIG 52 03/15/2012 0443   HDL 49 03/15/2012 0443   CHOLHDL 3.4 03/15/2012 0443   VLDL 10 03/15/2012 0443   LDLCALC 107* 03/15/2012 0443       Assessment and plan:   1. Diabetes mellitus of other type with complication (HCC)  - Flu Vaccine QUAD 36+ mos PF IM (Fluarix & Fluzone Quad PF) - COMPLETE METABOLIC PANEL WITH GFR - CBC with Differential - Lipid panel - Hemoglobin A1c - Microalbumin, urine  2. Need for prophylactic vaccination and inoculation against influenza -influenza vaccine given today  3. Special screening for malignant neoplasms, colon  - POC Hemoccult Bld/Stl (3-Cd Home Screen); Future  4. Need for Tdap vaccination - Tdap vaccine greater than or equal to 7yo IM   3 month follow-up  The patient was given clear instructions to go to ER or return to medical  center if symptoms don't improve, worsen or new problems develop. The patient verbalized understanding.    Henrietta HooverLinda C Kru Allman FNP  02/24/2016, 3:07 PM

## 2016-02-25 LAB — MICROALBUMIN, URINE: Microalb, Ur: 0.3 mg/dL

## 2016-02-26 MED ORDER — OMEPRAZOLE 40 MG PO CPDR: 40.0000 mg | DELAYED_RELEASE_CAPSULE | Freq: Every day | ORAL | Status: DC

## 2016-02-26 MED ORDER — METFORMIN HCL 500 MG PO TABS: 500.0000 mg | ORAL_TABLET | Freq: Two times a day (BID) | ORAL | Status: DC

## 2016-02-26 MED ORDER — PRAVASTATIN SODIUM 40 MG PO TABS: 40.0000 mg | ORAL_TABLET | Freq: Every day | ORAL | Status: DC

## 2016-02-26 MED ORDER — LISINOPRIL 20 MG PO TABS: 20.0000 mg | ORAL_TABLET | Freq: Every day | ORAL | Status: DC

## 2016-02-27 ENCOUNTER — Other Ambulatory Visit (INDEPENDENT_AMBULATORY_CARE_PROVIDER_SITE_OTHER): Payer: No Typology Code available for payment source

## 2016-02-27 ENCOUNTER — Telehealth: Payer: Self-pay

## 2016-02-27 DIAGNOSIS — Z1211 Encounter for screening for malignant neoplasm of colon: Secondary | ICD-10-CM

## 2016-02-27 LAB — POC HEMOCCULT BLD/STL (HOME/3-CARD/SCREEN)
Card #2 Fecal Occult Blod, POC: NEGATIVE
Card #3 Fecal Occult Blood, POC: NEGATIVE
Fecal Occult Blood, POC: NEGATIVE

## 2016-02-27 NOTE — Telephone Encounter (Signed)
Bonita QuinLinda,  Please advise. Where you going to send patient somewhere for prostate?

## 2016-02-27 NOTE — Telephone Encounter (Signed)
Pt called following up on the referral to a specialist for his prostate problems. Pt is requesting call back with information.

## 2016-03-02 ENCOUNTER — Telehealth: Payer: Self-pay

## 2016-03-02 ENCOUNTER — Other Ambulatory Visit: Payer: Self-pay | Admitting: Family Medicine

## 2016-03-02 MED ORDER — SULFAMETHOXAZOLE-TRIMETHOPRIM 800-160 MG PO TABS: 1.0000 | ORAL_TABLET | Freq: Two times a day (BID) | ORAL | Status: DC

## 2016-03-02 NOTE — Telephone Encounter (Signed)
John Hayden please advise if this can be refilled and I don't see a referral in the chart. Please advise. Thanks!

## 2016-03-02 NOTE — Telephone Encounter (Signed)
Pt called about referral requested for prostate. Pt also would like to know if a replacement medication has been ordered for his prescription, doxycyclline. The doxycycline is making him itch. Pt is requesting a call back. Thanks!

## 2016-03-02 NOTE — Telephone Encounter (Signed)
Pt is requesting medication refill for doxycycline. Patient also would like to know if he has been referred to anyone for his prostate problems. Pt is requesting a call back. Thanks!

## 2016-03-02 NOTE — Telephone Encounter (Signed)
Linda, Please advise. Thanks!  

## 2016-03-09 ENCOUNTER — Other Ambulatory Visit: Payer: Self-pay | Admitting: Family Medicine

## 2016-03-09 DIAGNOSIS — N419 Inflammatory disease of prostate, unspecified: Secondary | ICD-10-CM

## 2016-03-13 ENCOUNTER — Other Ambulatory Visit: Payer: Self-pay | Admitting: Family Medicine

## 2016-03-13 ENCOUNTER — Encounter: Payer: Self-pay | Admitting: Family Medicine

## 2016-03-13 ENCOUNTER — Ambulatory Visit: Payer: No Typology Code available for payment source

## 2016-03-13 VITALS — BP 179/82 | HR 59 | Temp 98.2°F | Resp 18

## 2016-03-13 DIAGNOSIS — N410 Acute prostatitis: Secondary | ICD-10-CM

## 2016-03-15 LAB — URINE CULTURE
Colony Count: NO GROWTH
Organism ID, Bacteria: NO GROWTH

## 2016-03-16 ENCOUNTER — Ambulatory Visit: Payer: No Typology Code available for payment source

## 2016-03-16 VITALS — BP 168/86

## 2016-03-16 DIAGNOSIS — I10 Essential (primary) hypertension: Secondary | ICD-10-CM

## 2016-03-16 MED ORDER — AMLODIPINE BESYLATE 5 MG PO TABS: 5.0000 mg | ORAL_TABLET | Freq: Every day | ORAL | Status: DC

## 2016-03-23 NOTE — Progress Notes (Unsigned)
Patient ID: John Hayden, male   DOB: 08/19/1951, 65 y.o.   MRN: [id number]

## 2016-03-30 ENCOUNTER — Telehealth: Payer: Self-pay

## 2016-03-30 NOTE — Telephone Encounter (Signed)
Left message for patient to call office for urine check and OV with Concepcion LivingLinda Bernhardt, NP so we can have documentation of prostatitis required for urology to see him. They sent a fax declining to see him for lack of documentation to support the diagnosis.

## 2016-03-31 NOTE — Telephone Encounter (Signed)
Spoke with patients daughter today and appointment has been made. Thanks!

## 2016-04-13 ENCOUNTER — Ambulatory Visit (INDEPENDENT_AMBULATORY_CARE_PROVIDER_SITE_OTHER): Payer: No Typology Code available for payment source | Admitting: Family Medicine

## 2016-04-13 ENCOUNTER — Encounter: Payer: Self-pay | Admitting: Family Medicine

## 2016-04-13 VITALS — BP 167/78 | HR 88 | Temp 98.3°F | Ht 64.0 in | Wt 180.0 lb

## 2016-04-13 DIAGNOSIS — Z0000 Encounter for general adult medical examination without abnormal findings: Secondary | ICD-10-CM

## 2016-04-13 DIAGNOSIS — IMO0001 Reserved for inherently not codable concepts without codable children: Secondary | ICD-10-CM

## 2016-04-13 DIAGNOSIS — R350 Frequency of micturition: Secondary | ICD-10-CM

## 2016-04-13 LAB — POCT URINALYSIS DIP (DEVICE)
BILIRUBIN URINE: NEGATIVE
GLUCOSE, UA: NEGATIVE mg/dL
Hgb urine dipstick: NEGATIVE
KETONES UR: NEGATIVE mg/dL
LEUKOCYTES UA: NEGATIVE
NITRITE: NEGATIVE
Protein, ur: NEGATIVE mg/dL
Specific Gravity, Urine: 1.02 (ref 1.005–1.030)
Urobilinogen, UA: 0.2 mg/dL (ref 0.0–1.0)
pH: 5.5 (ref 5.0–8.0)

## 2016-04-14 LAB — PSA: PSA: 1.05 ng/mL (ref <=4.00)

## 2016-04-17 NOTE — Progress Notes (Signed)
Patient ID: John Hayden, male   DOB: 08/14/1951, 65 y.o.   MRN: [id number]   John Hayden, is a 65 y.o. male  [id number]  [id number]  DOB - 12/13/1950  CC:  Chief Complaint  Patient presents with  . prostatitis    has dysuria, [place], has pain with ejaculation which lasts 20 minutes, and sometimes has pain in rectum which he feels is related to this issue, he also has urinary frequency       HPI: John Hayden is a 65 y.o. male who presents today for a discussion about his prostate.  He was seen in ED on 4/17 for pelvic pain. He was diagnosed and treated for prostatitis and it was recommended he see a urologist. A urine that was done that day was negative for infection. We placed a referral, based on ED recommendation to urolology. That referral was declined. He returns today to discuss this. His daughter is here as an Equities traderinterpreter. Even with her help, the language barrier makes this very difficult. The best I can get is that he has pain in the "pelvic basin". I cannot get a good description of the pain. I have reviewed this DRE in ED and have assured him that his prostate was not enlarged, tender, and had no nodules. I have determined that his concern about prostate cancer is the main concern, not the pain. He has recently had negative stool cards. We are deferring exam today, as he had a DRE less than a month ago that was normal.  Allergies  Allergen Reactions  . Doxycycline Itching   Past Medical History  Diagnosis Date  . Hypertension   . GERD (gastroesophageal reflux disease)   . Diabetes mellitus     borderline diabetic controlled by diet   . Chest pain     echo 4/13: EF 50-55%, mild AI;  Myoview 4/13: EF 59%, no ischemia  . Mild aortic insufficiency   . Thrombocytopenia (HCC)    Current Outpatient Prescriptions on File Prior to Visit  Medication Sig Dispense Refill  . amLODipine (NORVASC) 5 MG tablet Take 1 tablet (5 mg total) by  mouth daily. 90 tablet 3  . aspirin EC 81 MG tablet Take 81 mg by mouth daily. Reported on 02/24/2016    . lisinopril (PRINIVIL,ZESTRIL) 20 MG tablet Take 1 tablet (20 mg total) by mouth daily. 90 tablet 3  . metFORMIN (GLUCOPHAGE) 500 MG tablet Take 1 tablet (500 mg total) by mouth 2 (two) times daily with a meal. Reported on 02/24/2016 180 tablet 1  . omeprazole (PRILOSEC) 40 MG capsule Take 1 capsule (40 mg total) by mouth daily. Reported on 02/24/2016 90 capsule 1  . pravastatin (PRAVACHOL) 40 MG tablet Take 1 tablet (40 mg total) by mouth daily. 90 tablet 3  . PRESCRIPTION MEDICATION Take 4 mg by mouth daily. 1 tablet once daily. ( cardox 4 mg. ( for prostate)    . PRESCRIPTION MEDICATION Take 1 tablet by mouth daily. Reported on 03/13/2016    . sulfamethoxazole-trimethoprim (BACTRIM DS,SEPTRA DS) 800-160 MG tablet Take 1 tablet by mouth 2 (two) times daily. (Patient not taking: Reported on 03/13/2016) 20 tablet 0   No current facility-administered medications on file prior to visit.   History reviewed. No pertinent family history. Social History   Social History  . Marital Status: Married    Spouse Name: [name]  . Number of Children: [name]  . Years of Education: [name]   Occupational History  .  Not on file.   Social History Main Topics  . Smoking status: Former Games developer  . Smokeless tobacco: Never Used     Comment: 1997  . Alcohol Use: No  . Drug Use: No  . Sexual Activity: Not Currently   Other Topics Concern  . Not on file   Social History Narrative   Both parents are deceased, his mother had heart problems but his father did not. No siblings have heart problems. He is a retired Nurse, learning disability.     Objective:   Filed Vitals:   04/13/16 1542  BP: 167/78  Pulse: 88  Temp: 98.3 F (36.8 C)    Lab Results  Component Value Date   WBC 5.1 02/24/2016   HGB 14.6 02/24/2016   HCT 44.9 02/24/2016   MCV 81.9 02/24/2016   PLT 145* 02/24/2016   Lab Results  Component Value Date    CREATININE 1.18 02/24/2016   BUN 17 02/24/2016   NA 140 02/24/2016   K 3.9 02/24/2016   CL 102 02/24/2016   CO2 28 02/24/2016    Lab Results  Component Value Date   HGBA1C 7.1* 02/24/2016   Lipid Panel     Component Value Date/Time   CHOL 178 02/24/2016 1411   TRIG 108 02/24/2016 1411   HDL 48 02/24/2016 1411   CHOLHDL 3.7 02/24/2016 1411   VLDL 22 02/24/2016 1411   LDLCALC 108 02/24/2016 1411       Assessment and plan:   1. Pelvic Pain  - PSA -Have explained that if PSA is normal, this should relieve his worries.   Return if symptoms worsen or fail to improve.  The patient was given clear instructions to go to ER or return to medical center if symptoms don't improve, worsen or new problems develop. The patient verbalized understanding.    Henrietta Hoover FNP  04/17/2016, 7:45 AM

## 2016-09-04 ENCOUNTER — Ambulatory Visit: Payer: No Typology Code available for payment source | Admitting: Family Medicine

## 2016-09-30 ENCOUNTER — Ambulatory Visit (INDEPENDENT_AMBULATORY_CARE_PROVIDER_SITE_OTHER): Payer: Self-pay | Admitting: Family Medicine

## 2016-09-30 ENCOUNTER — Encounter: Payer: Self-pay | Admitting: Family Medicine

## 2016-09-30 VITALS — BP 147/82 | HR 51 | Temp 97.8°F | Resp 16 | Ht 64.0 in | Wt 177.0 lb

## 2016-09-30 DIAGNOSIS — Z23 Encounter for immunization: Secondary | ICD-10-CM

## 2016-09-30 DIAGNOSIS — E119 Type 2 diabetes mellitus without complications: Secondary | ICD-10-CM

## 2016-09-30 DIAGNOSIS — Z1159 Encounter for screening for other viral diseases: Secondary | ICD-10-CM

## 2016-09-30 DIAGNOSIS — Z114 Encounter for screening for human immunodeficiency virus [HIV]: Secondary | ICD-10-CM

## 2016-09-30 LAB — CBC WITH DIFFERENTIAL/PLATELET
Basophils Absolute: 0 cells/uL (ref 0–200)
Basophils Relative: 0 %
EOS ABS: 44 {cells}/uL (ref 15–500)
Eosinophils Relative: 1 %
HCT: 41.6 % (ref 38.5–50.0)
HEMOGLOBIN: 13.7 g/dL (ref 13.2–17.1)
LYMPHS ABS: 2200 {cells}/uL (ref 850–3900)
Lymphocytes Relative: 50 %
MCH: 27 pg (ref 27.0–33.0)
MCHC: 32.9 g/dL (ref 32.0–36.0)
MCV: 82.1 fL (ref 80.0–100.0)
MONO ABS: 396 {cells}/uL (ref 200–950)
MPV: 10.6 fL (ref 7.5–12.5)
Monocytes Relative: 9 %
NEUTROS ABS: 1760 {cells}/uL (ref 1500–7800)
Neutrophils Relative %: 40 %
Platelets: 146 10*3/uL (ref 140–400)
RBC: 5.07 MIL/uL (ref 4.20–5.80)
RDW: 14 % (ref 11.0–15.0)
WBC: 4.4 10*3/uL (ref 3.8–10.8)

## 2016-09-30 LAB — LIPID PANEL
Cholesterol: 170 mg/dL (ref 125–200)
HDL: 38 mg/dL — AB (ref >=40)
LDL Cholesterol: 108 mg/dL (ref <130)
Total CHOL/HDL Ratio: 4.5 Ratio (ref <=5.0)
Triglycerides: 118 mg/dL (ref <150)
VLDL: 24 mg/dL (ref <30)

## 2016-09-30 LAB — COMPLETE METABOLIC PANEL WITH GFR
ALBUMIN: 4.1 g/dL (ref 3.6–5.1)
ALK PHOS: 40 U/L (ref 40–115)
ALT: 16 U/L (ref 9–46)
AST: 19 U/L (ref 10–35)
BILIRUBIN TOTAL: 0.3 mg/dL (ref 0.2–1.2)
BUN: 17 mg/dL (ref 7–25)
CO2: 31 mmol/L (ref 20–31)
CREATININE: 1.16 mg/dL (ref 0.70–1.25)
Calcium: 9.4 mg/dL (ref 8.6–10.3)
Chloride: 101 mmol/L (ref 98–110)
GFR, EST AFRICAN AMERICAN: 75 mL/min (ref >=60)
GFR, EST NON AFRICAN AMERICAN: 65 mL/min (ref >=60)
GLUCOSE: 102 mg/dL — AB (ref 65–99)
Potassium: 3.4 mmol/L — ABNORMAL LOW (ref 3.5–5.3)
SODIUM: 139 mmol/L (ref 135–146)
TOTAL PROTEIN: 6.5 g/dL (ref 6.1–8.1)

## 2016-09-30 LAB — POCT GLYCOSYLATED HEMOGLOBIN (HGB A1C): Hemoglobin A1C: 7

## 2016-09-30 MED ORDER — METFORMIN HCL 500 MG PO TABS: 500.0000 mg | ORAL_TABLET | Freq: Two times a day (BID) | ORAL | 1 refills | Status: DC

## 2016-09-30 NOTE — Progress Notes (Signed)
John Hayden, is a 65 y.o. male  [id number]  [id number]  DOB - [birth year]  CC:  Chief Complaint  Patient presents with  . Hypertension       HPI: John Hayden is a 65 y.o. male here for follow-up on diabetes and hypertension. He is currently on amlodipine 5, lisinopril 20, metformin 500 bid, pravastatin 40. His A1C 3 months ago was 7.1. Today is 7.0. His BP today was 157/75. A repeat 148/82. He reports through a relative that he is having no significant issues. He does not exercise regularly, tries to watch carbs. He stopped smoking in 1997.  Health Maintenance: He is to receive a flu shot today.  Allergies  Allergen Reactions  . Doxycycline Itching   Past Medical History:  Diagnosis Date  . Chest pain    echo 4/13: EF 50-55%, mild AI;  Myoview 4/13: EF 59%, no ischemia  . Diabetes mellitus    borderline diabetic controlled by diet   . GERD (gastroesophageal reflux disease)   . Hypertension   . Mild aortic insufficiency   . Thrombocytopenia (HCC)    Current Outpatient Prescriptions on File Prior to Visit  Medication Sig Dispense Refill  . amLODipine (NORVASC) 5 MG tablet Take 1 tablet (5 mg total) by mouth daily. 90 tablet 3  . lisinopril (PRINIVIL,ZESTRIL) 20 MG tablet Take 1 tablet (20 mg total) by mouth daily. 90 tablet 3  . omeprazole (PRILOSEC) 40 MG capsule Take 1 capsule (40 mg total) by mouth daily. Reported on 02/24/2016 90 capsule 1  . pravastatin (PRAVACHOL) 40 MG tablet Take 1 tablet (40 mg total) by mouth daily. 90 tablet 3  . aspirin EC 81 MG tablet Take 81 mg by mouth daily. Reported on 02/24/2016    . PRESCRIPTION MEDICATION Take 4 mg by mouth daily. 1 tablet once daily. ( cardox 4 mg. ( for prostate)    . PRESCRIPTION MEDICATION Take 1 tablet by mouth daily. Reported on 03/13/2016     No current facility-administered medications on file prior to visit.    History reviewed. No pertinent family history. Social History   Social History  .  Marital status: Married    Spouse name: [name]  . Number of children: [name]  . Years of education: [name]   Occupational History  . Not on file.   Social History Main Topics  . Smoking status: Former Games developer  . Smokeless tobacco: Never Used     Comment: 1997  . Alcohol use No  . Drug use: No  . Sexual activity: Not Currently   Other Topics Concern  . Not on file   Social History Narrative   Both parents are deceased, his mother had heart problems but his father did not. No siblings have heart problems. He is a retired Nurse, learning disability.    Review of Systems: Constitutional: Negative Skin: Negative HENT: Negative  Eyes: Negative  Neck: Negative Respiratory: Negative Cardiovascular: Negative Gastrointestinal: Negative Genitourinary: Negative  Musculoskeletal: Negative   Neurological: Negative  Hematological: Negative  Psychiatric/Behavioral: Negative    Objective:   Vitals:   09/30/16 1011 09/30/16 1045  BP: (!) 157/75 (!) 147/82  Pulse: (!) 51   Resp: 16   Temp: 97.8 F (36.6 C)     Physical Exam: Constitutional: Patient appears well-developed and well-nourished. No distress. HENT: Normocephalic, atraumatic, External right and left ear normal. Oropharynx is clear and moist.  Eyes: Conjunctivae and EOM are normal. PERRLA, no scleral icterus. Neck: Normal ROM. Neck supple. No lymphadenopathy, No  thyromegaly. CVS: RRR, S1/S2 +, no murmurs, no gallops, no rubs Pulmonary: Effort and breath sounds normal, no stridor, rhonchi, wheezes, rales.  Abdominal: Soft. Normoactive BS,, no distension, tenderness, rebound or guarding.  Musculoskeletal: Normal range of motion. No edema and no tenderness.  Neuro: Alert.Normal muscle tone coordination. Non-focal Skin: Skin is warm and dry. No rash noted. Not diaphoretic. No erythema. No pallor. Psychiatric: Normal mood and affect. Behavior, judgment, thought content normal.  Lab Results  Component Value Date   WBC 5.1 02/24/2016    HGB 14.6 02/24/2016   HCT 44.9 02/24/2016   MCV 81.9 02/24/2016   PLT 145 (L) 02/24/2016   Lab Results  Component Value Date   CREATININE 1.18 02/24/2016   BUN 17 02/24/2016   NA 140 02/24/2016   K 3.9 02/24/2016   CL 102 02/24/2016   CO2 28 02/24/2016    Lab Results  Component Value Date   HGBA1C 7.0 09/30/2016   Lipid Panel     Component Value Date/Time   CHOL 178 02/24/2016 1411   TRIG 108 02/24/2016 1411   HDL 48 02/24/2016 1411   CHOLHDL 3.7 02/24/2016 1411   VLDL 22 02/24/2016 1411   LDLCALC 108 02/24/2016 1411       Assessment and plan:   1. Need for prophylactic vaccination and inoculation against influenza  - Flu Vaccine QUAD 36+ mos PF IM (Fluarix & Fluzone Quad PF)  2. Type 2 diabetes mellitus without complication, without long-term current use of insulin (HCC)  - CBC with Differential - COMPLETE METABOLIC PANEL WITH GFR - Lipid panel - POCT glycosylated hemoglobin (Hb A1C) - Ambulatory referral to Ophthalmology  3. Screening for HIV (human immunodeficiency virus)  - HIV antibody (with reflex)  4. Encounter for hepatitis C screening test for low risk patient  - Hepatitis C Antibody  5. Hypertension -With Systolic under 150, will not change medications.  6. Dry feet -recommended moisturizer.   No Follow-up on file.  The patient was given clear instructions to go to ER or return to medical center if symptoms don't improve, worsen or new problems develop. The patient verbalized understanding.    Henrietta HooverLinda C Davie Claud FNP  09/30/2016, 11:31 AM

## 2016-10-01 LAB — HIV ANTIBODY (ROUTINE TESTING W REFLEX): HIV: NONREACTIVE

## 2016-10-01 LAB — HEPATITIS C ANTIBODY: HCV Ab: NEGATIVE

## 2016-10-02 ENCOUNTER — Ambulatory Visit: Payer: No Typology Code available for payment source | Attending: Internal Medicine

## 2016-11-06 NOTE — Progress Notes (Unsigned)
   John Hayden, is a 65 y.o. male  [id number]:[id number]  [id number]  DOB - 10/14/1951  CC: No chief complaint on file.      HPI: John Hayden is a [age] y.o. male here for nurse visit for BP check.   Allergies  Allergen Reactions  . Doxycycline Itching   Past Medical History:  Diagnosis Date  . Chest pain    echo 4/13: EF 50-55%, mild AI;  Myoview 4/13: EF 59%, no ischemia  . Diabetes mellitus    borderline diabetic controlled by diet   . GERD (gastroesophageal reflux disease)   . Hypertension   . Mild aortic insufficiency   . Thrombocytopenia (HCC)    Current Outpatient Prescriptions on File Prior to Visit  Medication Sig Dispense Refill  . aspirin EC 81 MG tablet Take 81 mg by mouth daily. Reported on 02/24/2016    . lisinopril (PRINIVIL,ZESTRIL) 20 MG tablet Take 1 tablet (20 mg total) by mouth daily. 90 tablet 3  . omeprazole (PRILOSEC) 40 MG capsule Take 1 capsule (40 mg total) by mouth daily. Reported on 02/24/2016 90 capsule 1  . pravastatin (PRAVACHOL) 40 MG tablet Take 1 tablet (40 mg total) by mouth daily. 90 tablet 3  . PRESCRIPTION MEDICATION Take 4 mg by mouth daily. 1 tablet once daily. ( cardox 4 mg. ( for prostate)    . PRESCRIPTION MEDICATION Take 1 tablet by mouth daily. Reported on 03/13/2016     No current facility-administered medications on file prior to visit.    No family history on file. Social History   Social History  . Marital status: Married    Spouse name: [name]  . Number of children: [name]  . Years of education: [name]   Occupational History  . Not on file.   Social History Main Topics  . Smoking status: Former Games developermoker  . Smokeless tobacco: Never Used     Comment: 1997  . Alcohol use No  . Drug use: No  . Sexual activity: Not Currently   Other Topics Concern  . Not on file   Social History Narrative   Both parents are deceased, his mother had heart problems but his father did not. No siblings have heart problems. He is a retired  Nurse, learning disabilitycustoms officer.    R Objective:   Vitals:   03/16/16 1340 03/16/16 1342  BP: (!) 165/88 (!) 168/86     Lab Results  Component Value Date   CREATININE 1.16 09/30/2016   BUN 17 09/30/2016   NA 139 09/30/2016   K 3.4 (L) 09/30/2016   CL 101 09/30/2016   CO2 31 09/30/2016    Lab Results  Component Value Date   HGBA1C 7.0 09/30/2016   Lipid Panel     Component Value Date/Time   CHOL 170 09/30/2016 1052   TRIG 118 09/30/2016 1052   HDL 38 (L) 09/30/2016 1052   CHOLHDL 4.5 09/30/2016 1052   VLDL 24 09/30/2016 1052   LDLCALC 108 09/30/2016 1052        Assessment and plan:   Hypertension  No Follow-up on file.  The patient was given clear instructions to go to ER or return to medical center if symptoms don't improve, worsen or new problems develop. The patient verbalized understanding.    Scc-Scc Nurse   11/06/2016, 2:20 PM

## 2017-04-05 ENCOUNTER — Ambulatory Visit: Payer: No Typology Code available for payment source | Admitting: Family Medicine

## 2017-04-07 ENCOUNTER — Ambulatory Visit: Payer: No Typology Code available for payment source | Admitting: Family Medicine

## 2017-04-12 ENCOUNTER — Encounter: Payer: Self-pay | Admitting: Family Medicine

## 2017-04-12 ENCOUNTER — Ambulatory Visit (INDEPENDENT_AMBULATORY_CARE_PROVIDER_SITE_OTHER): Payer: Self-pay | Admitting: Family Medicine

## 2017-04-12 VITALS — BP 140/80 | HR 78 | Temp 97.9°F | Resp 18 | Ht 64.0 in | Wt 169.0 lb

## 2017-04-12 DIAGNOSIS — I10 Essential (primary) hypertension: Secondary | ICD-10-CM

## 2017-04-12 DIAGNOSIS — D696 Thrombocytopenia, unspecified: Secondary | ICD-10-CM

## 2017-04-12 DIAGNOSIS — E785 Hyperlipidemia, unspecified: Secondary | ICD-10-CM

## 2017-04-12 DIAGNOSIS — I351 Nonrheumatic aortic (valve) insufficiency: Secondary | ICD-10-CM

## 2017-04-12 DIAGNOSIS — R7989 Other specified abnormal findings of blood chemistry: Secondary | ICD-10-CM

## 2017-04-12 DIAGNOSIS — Z794 Long term (current) use of insulin: Secondary | ICD-10-CM

## 2017-04-12 DIAGNOSIS — R946 Abnormal results of thyroid function studies: Secondary | ICD-10-CM

## 2017-04-12 DIAGNOSIS — E119 Type 2 diabetes mellitus without complications: Secondary | ICD-10-CM

## 2017-04-12 LAB — COMPLETE METABOLIC PANEL WITH GFR
ALK PHOS: 48 U/L (ref 40–115)
ALT: 18 U/L (ref 9–46)
AST: 19 U/L (ref 10–35)
Albumin: 4.2 g/dL (ref 3.6–5.1)
BUN: 16 mg/dL (ref 7–25)
CALCIUM: 9.3 mg/dL (ref 8.6–10.3)
CHLORIDE: 103 mmol/L (ref 98–110)
CO2: 26 mmol/L (ref 20–31)
Creat: 0.95 mg/dL (ref 0.70–1.25)
GFR, EST NON AFRICAN AMERICAN: 82 mL/min (ref >=60)
GFR, Est African American: >89 mL/min (ref >=60)
GLUCOSE: 90 mg/dL (ref 65–99)
POTASSIUM: 3.7 mmol/L (ref 3.5–5.3)
Sodium: 141 mmol/L (ref 135–146)
TOTAL PROTEIN: 6.7 g/dL (ref 6.1–8.1)
Total Bilirubin: 0.3 mg/dL (ref 0.2–1.2)

## 2017-04-12 LAB — CBC WITH DIFFERENTIAL/PLATELET
Basophils Absolute: 0 cells/uL (ref 0–200)
Basophils Relative: 0 %
EOS ABS: 40 {cells}/uL (ref 15–500)
Eosinophils Relative: 1 %
HEMATOCRIT: 43.9 % (ref 38.5–50.0)
HEMOGLOBIN: 14.3 g/dL (ref 13.2–17.1)
LYMPHS PCT: 47 %
Lymphs Abs: 1880 cells/uL (ref 850–3900)
MCH: 26.7 pg — ABNORMAL LOW (ref 27.0–33.0)
MCHC: 32.6 g/dL (ref 32.0–36.0)
MCV: 82.1 fL (ref 80.0–100.0)
MPV: 10.5 fL (ref 7.5–12.5)
Monocytes Absolute: 480 cells/uL (ref 200–950)
Monocytes Relative: 12 %
NEUTROS PCT: 40 %
Neutro Abs: 1600 cells/uL (ref 1500–7800)
Platelets: 168 10*3/uL (ref 140–400)
RBC: 5.35 MIL/uL (ref 4.20–5.80)
RDW: 15 % (ref 11.0–15.0)
WBC: 4 10*3/uL (ref 3.8–10.8)

## 2017-04-12 LAB — LIPID PANEL
CHOLESTEROL: 164 mg/dL (ref <200)
HDL: 45 mg/dL (ref >40)
LDL Cholesterol: 98 mg/dL (ref <100)
TRIGLYCERIDES: 105 mg/dL (ref <150)
Total CHOL/HDL Ratio: 3.6 Ratio (ref <5.0)
VLDL: 21 mg/dL (ref <30)

## 2017-04-12 LAB — POCT GLYCOSYLATED HEMOGLOBIN (HGB A1C): HEMOGLOBIN A1C: 6.7

## 2017-04-12 MED ORDER — PRAVASTATIN SODIUM 40 MG PO TABS: 40.0000 mg | ORAL_TABLET | Freq: Every day | ORAL | 3 refills | Status: DC

## 2017-04-12 MED ORDER — MELOXICAM 15 MG PO TABS: 7.5000 mg | ORAL_TABLET | Freq: Two times a day (BID) | ORAL | 1 refills | Status: DC | PRN

## 2017-04-12 MED ORDER — OMEPRAZOLE 40 MG PO CPDR: 40.0000 mg | DELAYED_RELEASE_CAPSULE | Freq: Every day | ORAL | 1 refills | Status: DC

## 2017-04-12 MED ORDER — ASPIRIN EC 81 MG PO TBEC: 81.0000 mg | DELAYED_RELEASE_TABLET | Freq: Every day | ORAL | 1 refills | Status: DC

## 2017-04-12 MED ORDER — AMLODIPINE BESYLATE 5 MG PO TABS: 5.0000 mg | ORAL_TABLET | Freq: Every day | ORAL | 3 refills | Status: DC

## 2017-04-12 MED ORDER — DICYCLOMINE HCL 10 MG PO CAPS: 10.0000 mg | ORAL_CAPSULE | Freq: Three times a day (TID) | ORAL | 3 refills | Status: DC

## 2017-04-12 MED ORDER — METFORMIN HCL 500 MG PO TABS: 500.0000 mg | ORAL_TABLET | Freq: Two times a day (BID) | ORAL | 2 refills | Status: DC

## 2017-04-12 MED ORDER — MELOXICAM 15 MG PO TABS: 7.5000 mg | ORAL_TABLET | Freq: Every day | ORAL | 1 refills | Status: DC | PRN

## 2017-04-12 MED ORDER — LISINOPRIL 20 MG PO TABS: 20.0000 mg | ORAL_TABLET | Freq: Every day | ORAL | 3 refills | Status: DC

## 2017-04-12 MED ORDER — MELOXICAM 15 MG PO TABS: 7.5000 mg | ORAL_TABLET | Freq: Every day | ORAL | 1 refills | Status: DC

## 2017-04-12 NOTE — Patient Instructions (Addendum)
Please call me once your orange card is reinstated for a referral for gastroenterology-Colonoscopy and opthalmology.  For diarrhea and abdominal upset, start Bentyl 10 mg 3 times daily. Continue omeprazole 40 mg once daily for acid reflux symptoms.   Continue all other medication as prescribed. A1C 6.7 looks great today!  For arthritis type pain, you may take 7.5 Meloxicam once daily as needed. Warm soaks and exercises limbs will improve stiffness and reduce arthralgias.     Irritable Bowel Syndrome, Adult Irritable bowel syndrome (IBS) is not one specific disease. It is a group of symptoms that affects the organs responsible for digestion (gastrointestinal or GI tract). To regulate how your GI tract works, your body sends signals back and forth between your intestines and your brain. If you have IBS, there may be a problem with these signals. As a result, your GI tract does not function normally. Your intestines may become more sensitive and overreact to certain things. This is especially true when you eat certain foods or when you are under stress. There are four types of IBS. These may be determined based on the consistency of your stool:  IBS with diarrhea.  IBS with constipation.  Mixed IBS.  Unsubtyped IBS. It is important to know which type of IBS you have. Some treatments are more likely to be helpful for certain types of IBS. What are the causes? The exact cause of IBS is not known. What increases the risk? You may have a higher risk of IBS if:  You are a woman.  You are younger than 68 years old.  You have a family history of IBS.  You have mental health problems.  You have had bacterial infection of your GI tract. What are the signs or symptoms? Symptoms of IBS vary from person to person. The main symptom is abdominal pain or discomfort. Additional symptoms usually include one or more of the following:  Diarrhea, constipation, or both.  Abdominal swelling or  bloating.  Feeling full or sick after eating a small or regular-size meal.  Frequent gas.  Mucus in the stool.  A feeling of having more stool left after a bowel movement. Symptoms tend to come and go. They may be associated with stress, psychiatric conditions, or nothing at all. How is this diagnosed? There is no specific test to diagnose IBS. Your health care provider will make a diagnosis based on a physical exam, medical history, and your symptoms. You may have other tests to rule out other conditions that may be causing your symptoms. These may include:  Blood tests.  X-rays.  CT scan.  Endoscopy and colonoscopy. This is a test in which your GI tract is viewed with a long, thin, flexible tube. How is this treated? There is no cure for IBS, but treatment can help relieve symptoms. IBS treatment often includes:  Changes to your diet, such as:  Eating more fiber.  Avoiding foods that cause symptoms.  Drinking more water.  Eating regular, medium-sized portioned meals.  Medicines. These may include:  Fiber supplements if you have constipation.  Medicine to control diarrhea (antidiarrheal medicines).  Medicine to help control muscle spasms in your GI tract (antispasmodic medicines).  Medicines to help with any mental health issues, such as antidepressants or tranquilizers.  Therapy.  Talk therapy may help with anxiety, depression, or other mental health issues that can make IBS symptoms worse.  Stress reduction.  Managing your stress can help keep symptoms under control. Follow these instructions at home:  Take medicines only as directed by your health care provider.  Eat a healthy diet.  Avoid foods and drinks with added sugar.  Include more whole grains, fruits, and vegetables gradually into your diet. This may be especially helpful if you have IBS with constipation.  Avoid any foods and drinks that make your symptoms worse. These may include dairy  products and caffeinated or carbonated drinks.  Do not eat large meals.  Drink enough fluid to keep your urine clear or pale yellow.  Exercise regularly. Ask your health care provider for recommendations of good activities for you.  Keep all follow-up visits as directed by your health care provider. This is important. Contact a health care provider if:  You have constant pain.  You have trouble or pain with swallowing.  You have worsening diarrhea. Get help right away if:  You have severe and worsening abdominal pain.  You have diarrhea and:  You have a rash, stiff neck, or severe headache.  You are irritable, sleepy, or difficult to awaken.  You are weak, dizzy, or extremely thirsty.  You have bright red blood in your stool or you have black tarry stools.  You have unusual abdominal swelling that is painful.  You vomit continuously.  You vomit blood (hematemesis).  You have both abdominal pain and a fever. This information is not intended to replace advice given to you by your health care provider. Make sure you discuss any questions you have with your health care provider. Document Released: 11/23/2005 Document Revised: 04/24/2016 Document Reviewed: 08/10/2014 Elsevier Interactive Patient Education  2017 [employer]  Arthritis Arthritis means joint pain. It can also mean joint disease. A joint is a place where bones come together. People who have arthritis may have:  Red joints.  Swollen joints.  Stiff joints.  Warm joints.  A fever.  A feeling of being sick. Follow these instructions at home: Pay attention to any changes in your symptoms. Take these actions to help with your pain and swelling. Medicines   Take over-the-counter and prescription medicines only as told by your doctor.  Do not take aspirin for pain if your doctor says that you may have gout. Activity   Rest your joint if your doctor tells you to.  Avoid activities that make the pain  worse.  Exercise your joint regularly as told by your doctor. Try doing exercises like:  Swimming.  Water aerobics.  Biking.  Walking. Joint Care    If your joint is swollen, keep it raised (elevated) if told by your doctor.  If your joint feels stiff in the morning, try taking a warm shower.  If you have diabetes, do not apply heat without asking your doctor.  If told, apply heat to the joint:  Put a towel between the joint and the hot pack or heating pad.  Leave the heat on the area for 20-30 minutes.  If told, apply ice to the joint:  Put ice in a plastic bag.  Place a towel between your skin and the bag.  Leave the ice on for 20 minutes, 2-3 times per day.  Keep all follow-up visits as told by your doctor. Contact a doctor if:  The pain gets worse.  You have a fever. Get help right away if:  You have very bad pain in your joint.  You have swelling in your joint.  Your joint is red.  Many joints become painful and swollen.  You have very bad back pain.  Your leg  is very weak.  You cannot control your pee (urine) or poop (stool). This information is not intended to replace advice given to you by your health care provider. Make sure you discuss any questions you have with your health care provider. Document Released: 02/17/2010 Document Revised: 04/30/2016 Document Reviewed: 02/18/2015 Elsevier Interactive Patient Education  2017 [employer].

## 2017-04-12 NOTE — Progress Notes (Signed)
Patient ID: John Hayden, male    DOB: 04-21-1951, 66 y.o.   MRN: [id number]  PCP: Bing Neighbors, FNP  Chief Complaint  Patient presents with  . Follow-up    BLOOD PRESSURE/DIABETES     Subjective:  HPI John Hayden is a 66 y.o. male presents for hypertension and diabetes follow-up.  Medical problems include: Hypertension, Type 2 Diabetes, Left Heart Cath (2013), and Mild Aortic Insufficiency.  Mr. John Hayden is accompanied by his daughter today who helps with interpretation. He understands some [place], and speaks minimal  [place]. Native of [place].  Hypertension  He is currently prescribed amlodipine 5 mg daily and lisinopril 20 mg daily. He walks on average 1-2 miles daily. He continues to consume sodium daily but admits to "small amounts of consumption". Denies chest pain, shortness of breath, headaches, lower extremity swelling, or dizziness. He has a hx of clean left heart cath and echo showed evidence mild aortic insufficiency in 2013 after an admission for atypical chest pain. His last cardiology follow-up was 2013.  Type 2 Diabetes,  Last hemoglobin A1C 7.0. 09/2016. He is currently taking Metformin 500 mg, twice daily. He eats 3 times per day and engages in regular physical activity. No routine monitoring of blood sugar at home. Denies any numbness,  tingling, increased thirst, and or increased urination.   Health Maintenance PSA was check 1 year ago and was within normal range. He denies weak urinary stream or hesitancy. He is overdue for colonoscopy and at present his [employer] and [facility] Patient Assistance have both expired. No recent opthalmic exam and denies visual disturbances.   Social History   Social History  . Marital status: Married    Spouse name: [name]  . Number of children: [name]  . Years of education: [name]   Occupational History  . Not on file.   Social History Main Topics  . Smoking status: Former Games developer  . Smokeless tobacco: Never  Used     Comment: 1997  . Alcohol use No  . Drug use: No  . Sexual activity: Not Currently   Other Topics Concern  . Not on file   Social History Narrative   Both parents are deceased, his mother had heart problems but his father did not. No siblings have heart problems. He is a retired Nurse, learning disability.   Review of Systems See HPI  Patient Active Problem List   Diagnosis Date Noted  . Hypertension 03/30/2012  . Mild AI (aortic insufficiency) 03/30/2012  . Thrombocytopenia (HCC) 03/30/2012  . Chest pain 03/14/2012    Allergies  Allergen Reactions  . Doxycycline Itching    Prior to Admission medications   Medication Sig Start Date End Date Taking? Authorizing Provider  amLODipine (NORVASC) 5 MG tablet Take 1 tablet (5 mg total) by mouth daily. 03/16/16   Henrietta Hoover, NP  aspirin EC 81 MG tablet Take 81 mg by mouth daily. Reported on 02/24/2016    [provider]  lisinopril (PRINIVIL,ZESTRIL) 20 MG tablet Take 1 tablet (20 mg total) by mouth daily. 02/26/16   Henrietta Hoover, NP  metFORMIN (GLUCOPHAGE) 500 MG tablet Take 1 tablet (500 mg total) by mouth 2 (two) times daily with a meal. 09/30/16   Henrietta Hoover, NP  omeprazole (PRILOSEC) 40 MG capsule Take 1 capsule (40 mg total) by mouth daily. Reported on 02/24/2016 02/26/16   Henrietta Hoover, NP  pravastatin (PRAVACHOL) 40 MG tablet Take 1 tablet (40 mg total) by mouth daily.  02/26/16   Henrietta HooverBernhardt, Linda C, NP  PRESCRIPTION MEDICATION Take 4 mg by mouth daily. 1 tablet once daily. ( cardox 4 mg. ( for prostate)    [provider]  PRESCRIPTION MEDICATION Take 1 tablet by mouth daily. Reported on 03/13/2016    [provider]    Past Medical, Surgical Family and Social History reviewed and updated.    Objective:  Blood pressure 140/80, pulse 78, temperature 97.9 F (36.6 C), temperature source Oral, resp. rate 18, height 5\' 4"  (1.626 m), weight 169 lb (76.7 kg), SpO2 100 %.   Filed  Weights   04/12/17 1003  Weight: 169 lb (76.7 kg)   Physical Exam  Constitutional: He is oriented to person, place, and time. He appears well-developed and well-nourished.  HENT:  Head: Normocephalic and atraumatic.  Eyes: Pupils are equal, round, and reactive to light.  Neck: Normal range of motion. Neck supple.  Cardiovascular: Normal rate, regular rhythm, normal heart sounds and intact distal pulses.   Pulmonary/Chest: Effort normal and breath sounds normal.  Abdominal: Soft.  Musculoskeletal: Normal range of motion.  Neurological: He is alert and oriented to person, place, and time.  Psychiatric: He has a normal mood and affect. His behavior is normal. Judgment and thought content normal.     Assessment & Plan:  1. Essential hypertension, uncontrolled today. Needs refills on medications. -Continue Amlodipine and Lisinopril.  2. Mild Aortic Valve Regurgitation (aortic insufficiency) -Will consider cardiology referral in the future if needed. No audible murmur noted on exam and asymptomatic of worrisome cardiovascular symptoms.  3. Type 2 diabetes mellitus without complication, with long-term current use of insulin (HCC)  -POCT glycosylated hemoglobin (Hb A1C), 6.7 today, controlled   -HM Diabetes Foot Exam  -Continue Metformin as prescribed  4. Hyperlipidemia, unspecified hyperlipidemia type - Lipid panel - Thyroid Panel With TSH  5. Thrombocytopenia (HCC) - CBC with Differential  RTC: 6 months-Chronic Disease management, EKG, and Colonoscopy referral if coverage.   Godfrey PickKimberly S. Tiburcio PeaHarris, MSN, [facility] [address] [place], [place] [address] [phone]

## 2017-04-13 LAB — THYROID PANEL WITH TSH
FREE THYROXINE INDEX: 1.6 (ref 1.4–3.8)
T3 Uptake: 31 % (ref 22–35)
T4, Total: 5.1 ug/dL (ref 4.5–12.0)
TSH: 6.42 mIU/L — ABNORMAL HIGH (ref 0.40–4.50)

## 2017-04-13 LAB — POCT URINALYSIS DIP (DEVICE)
BILIRUBIN URINE: NEGATIVE
GLUCOSE, UA: NEGATIVE mg/dL
Hgb urine dipstick: NEGATIVE
KETONES UR: NEGATIVE mg/dL
Leukocytes, UA: NEGATIVE
Nitrite: NEGATIVE
PH: 7 (ref 5.0–8.0)
PROTEIN: NEGATIVE mg/dL
SPECIFIC GRAVITY, URINE: 1.015 (ref 1.005–1.030)
Urobilinogen, UA: 0.2 mg/dL (ref 0.0–1.0)

## 2017-04-19 ENCOUNTER — Ambulatory Visit: Payer: Self-pay | Attending: Internal Medicine

## 2017-04-26 ENCOUNTER — Telehealth: Payer: Self-pay

## 2017-04-26 DIAGNOSIS — Z1211 Encounter for screening for malignant neoplasm of colon: Secondary | ICD-10-CM

## 2017-04-26 NOTE — Telephone Encounter (Signed)
They were suppose to contact me once patient received orange card. I will place referral but we need some form of insurance or patient assistance. Please call to confirm whether or not coverage is in place

## 2017-04-26 NOTE — Telephone Encounter (Signed)
Patient wife states that we are suppose to be putting in referral for patient to see gastro.

## 2017-05-07 ENCOUNTER — Telehealth: Payer: Self-pay

## 2017-05-07 NOTE — Telephone Encounter (Signed)
Patient notified that referral was sent on 05/07/2017.

## 2017-06-03 ENCOUNTER — Encounter: Payer: Self-pay | Admitting: Family Medicine

## 2017-06-03 ENCOUNTER — Ambulatory Visit (INDEPENDENT_AMBULATORY_CARE_PROVIDER_SITE_OTHER): Payer: No Typology Code available for payment source | Admitting: Family Medicine

## 2017-06-03 VITALS — BP 156/90 | HR 80 | Temp 98.1°F | Resp 14 | Ht 64.0 in | Wt 173.0 lb

## 2017-06-03 DIAGNOSIS — R7989 Other specified abnormal findings of blood chemistry: Secondary | ICD-10-CM

## 2017-06-03 DIAGNOSIS — R946 Abnormal results of thyroid function studies: Secondary | ICD-10-CM

## 2017-06-03 DIAGNOSIS — R197 Diarrhea, unspecified: Secondary | ICD-10-CM

## 2017-06-03 MED ORDER — SAXAGLIPTIN HCL 5 MG PO TABS: 5.0000 mg | ORAL_TABLET | Freq: Every day | ORAL | 0 refills | Status: DC

## 2017-06-03 NOTE — Progress Notes (Signed)
Patient ID: John Hayden, male    DOB: 03/25/1951, 66 y.o.   MRN: [id number]  PCP: Bing NeighborsHarris, Kyren Knick S, FNP  Chief Complaint  Patient presents with  . Diarrhea    x 3 months     Subjective:  HPI John Hayden is a 66 y.o. male presents for evaluation of Persistent diarrhea 3 months. Reports watery stools almost daily for over the last 3 months. He has attempted to relieve diarrhea with Bentyl 10 mg 3 times daily without significant relief. He takes metformin 500 mg 2 times daily for diabetes last A1c 6.7. He denies any associated abdominal pain and or noticing any blood in his stool. He reports associated flatulence. He also denies any prior history of having any chronic episodes of diarrhea. His diarrhea most often occurs first thing in the morning and he may or may not have an additional loose stool later in the day. He has never had a colonoscopy and currently has a pending referral to gastroenterology.   Social History   Social History  . Marital status: Married    Spouse name: [name]  . Number of children: [name]  . Years of education: [name]   Occupational History  . Not on file.   Social History Main Topics  . Smoking status: Former Games developermoker  . Smokeless tobacco: Never Used     Comment: 1997  . Alcohol use No  . Drug use: No  . Sexual activity: Not Currently   Other Topics Concern  . Not on file   Social History Narrative   Both parents are deceased, his mother had heart problems but his father did not. No siblings have heart problems. He is a retired Nurse, learning disabilitycustoms officer.    History reviewed. No pertinent family history. Review of Systems  see history of present illness  Patient Active Problem List   Diagnosis Date Noted  . Hypertension 03/30/2012  . Mild AI (aortic insufficiency) 03/30/2012  . Thrombocytopenia (HCC) 03/30/2012  . Chest pain 03/14/2012    Allergies  Allergen Reactions  . Doxycycline Itching    Prior to Admission medications   Medication Sig  Start Date End Date Taking? Authorizing Provider  amLODipine (NORVASC) 5 MG tablet Take 1 tablet (5 mg total) by mouth daily. 04/12/17  Yes Bing NeighborsHarris, Dilyn Smiles S, FNP  aspirin EC 81 MG tablet Take 1 tablet (81 mg total) by mouth daily. Reported on 02/24/2016 04/12/17  Yes Bing NeighborsHarris, Amelianna Meller S, FNP  dicyclomine (BENTYL) 10 MG capsule Take 1 capsule (10 mg total) by mouth 4 (four) times daily -  before meals and at bedtime. 04/12/17  Yes Bing NeighborsHarris, Aarion Metzgar S, FNP  lisinopril (PRINIVIL,ZESTRIL) 20 MG tablet Take 1 tablet (20 mg total) by mouth daily. 04/12/17  Yes Bing NeighborsHarris, Alizaya Oshea S, FNP  meloxicam (MOBIC) 15 MG tablet Take 0.5 tablets (7.5 mg total) by mouth daily as needed for pain. 04/12/17  Yes Bing NeighborsHarris, Posey Jasmin S, FNP  metFORMIN (GLUCOPHAGE) 500 MG tablet Take 1 tablet (500 mg total) by mouth 2 (two) times daily with a meal. 04/12/17  Yes Bing NeighborsHarris, Damek Ende S, FNP  omeprazole (PRILOSEC) 40 MG capsule Take 1 capsule (40 mg total) by mouth daily. Reported on 02/24/2016 04/12/17  Yes Bing NeighborsHarris, Edgerrin Correia S, FNP  pravastatin (PRAVACHOL) 40 MG tablet Take 1 tablet (40 mg total) by mouth daily. 04/12/17  Yes Bing NeighborsHarris, Rishon Thilges S, FNP  PRESCRIPTION MEDICATION Take 4 mg by mouth daily. 1 tablet once daily. ( cardox 4 mg. ( for prostate)   Yes [provider]  PRESCRIPTION MEDICATION Take 1 tablet by mouth daily. Reported on 03/13/2016   Yes [provider]    Past Medical, Surgical Family and Social History reviewed and updated.    Objective:       Today's Vitals   06/03/17 0841  BP: (!) 156/90  Pulse: 80  Resp: 14  Temp: 98.1 F (36.7 C)  TempSrc: Oral  SpO2: 100%  Weight: 173 lb (78.5 kg)  Height: 5\' 4"  (1.626 m)    Wt Readings from Last 3 Encounters:  06/03/17 173 lb (78.5 kg)  04/12/17 169 lb (76.7 kg)  09/30/16 177 lb (80.3 kg)    Physical Exam  Constitutional: He is oriented to person, place, and time. He appears well-developed and well-nourished.  HENT:  Head: Normocephalic and atraumatic.   Eyes: Conjunctivae are normal. Pupils are equal, round, and reactive to light.  Cardiovascular: Normal rate, regular rhythm, normal heart sounds and intact distal pulses.   Pulmonary/Chest: Effort normal and breath sounds normal.  Abdominal: Soft. Bowel sounds are normal. He exhibits no distension and no mass. There is no tenderness. There is no rebound and no guarding.  Musculoskeletal: Normal range of motion.  Neurological: He is alert and oriented to person, place, and time.  Psychiatric: He has a normal mood and affect. His behavior is normal. Judgment and thought content normal.   Assessment & Plan:  1. Abnormal TSH - Thyroid Panel With TSH  2. Diarrhea, unspecified type - Gastrointestinal Pathogen Panel PCR - Clostridium Difficile by PCR  Diarrhea is most likely due to an intolerance to metformin, we will try STOPPING metformin for 30 days and starting patient on saxagliptin 5 mg once daily. I will also obtain a stool sample in one a GI pathogen panel as well as C. difficile to ensure that there is no underlying infection as the cause of continued diarrhea.    RTC: One month for recheck A1c  and reevaluate diarrhea.  Godfrey Pick. Tiburcio Pea, MSN, FNP-C [facility]  [address] [place], [place] [address] [phone]

## 2017-06-03 NOTE — Patient Instructions (Signed)
Please hold off on taking her metformin over the next 30 days. I feel this is likely the source of his worsening daily diarrhea. I am placing you on saxagliptin 5 mg once daily with breakfast in the meantime.  Referral to GI still pending.

## 2017-06-04 ENCOUNTER — Telehealth: Payer: Self-pay

## 2017-06-04 LAB — THYROID PANEL WITH TSH
FREE THYROXINE INDEX: 1.5 (ref 1.4–3.8)
T3 UPTAKE: 33 % (ref 22–35)
T4, Total: 4.5 ug/dL (ref 4.5–12.0)
TSH: 5.72 mIU/L — ABNORMAL HIGH (ref 0.40–4.50)

## 2017-06-04 NOTE — Telephone Encounter (Signed)
Spoke with Francesco SorLincoln from [employer] and he will check on status of referral. Clydie BraunKaren the lady that handles referrals is out of office until 06/23/2017

## 2017-06-04 NOTE — Telephone Encounter (Signed)
-----   Message from Kimberly S Harris, FNP sent at 06/03/2017  1:29 PM EDT ----- Could you follow-up regarding referral to GI to ensure it was received for patient. They were in office again today requesting a status 

## 2017-06-04 NOTE — Telephone Encounter (Signed)
[facility] a [facility]

## 2017-06-04 NOTE — Telephone Encounter (Signed)
-----   Message from Bing NeighborsKimberly S Harris, FNP sent at 06/03/2017  1:29 PM EDT ----- Could you follow-up regarding referral to GI to ensure it was received for patient. They were in office again today requesting a status

## 2017-06-08 ENCOUNTER — Other Ambulatory Visit: Payer: No Typology Code available for payment source

## 2017-06-08 ENCOUNTER — Encounter: Payer: Self-pay | Admitting: Family Medicine

## 2017-06-08 DIAGNOSIS — R197 Diarrhea, unspecified: Secondary | ICD-10-CM

## 2017-06-09 LAB — CLOSTRIDIUM DIFFICILE BY PCR

## 2017-06-10 ENCOUNTER — Telehealth: Payer: Self-pay | Admitting: Family Medicine

## 2017-06-10 ENCOUNTER — Other Ambulatory Visit: Payer: Self-pay | Admitting: Family Medicine

## 2017-06-10 LAB — GASTROINTESTINAL PATHOGEN PANEL PCR
C. DIFFICILE TOX A/B, PCR: NOT DETECTED
Campylobacter, PCR: NOT DETECTED
Cryptosporidium, PCR: NOT DETECTED
E COLI (ETEC) LT/ST, PCR: NOT DETECTED
E COLI (STEC) STX1/STX2, PCR: NOT DETECTED
E coli 0157, PCR: NOT DETECTED
Giardia lamblia, PCR: NOT DETECTED
Norovirus, PCR: NOT DETECTED
ROTAVIRUS, PCR: NOT DETECTED
SALMONELLA, PCR: NOT DETECTED
Shigella, PCR: DETECTED — CR

## 2017-06-10 MED ORDER — CIPROFLOXACIN HCL 500 MG PO TABS: 500.0000 mg | ORAL_TABLET | Freq: Two times a day (BID) | ORAL | 0 refills | Status: AC

## 2017-06-10 NOTE — Progress Notes (Unsigned)
Received a critical lab patient is positive for Shingella. E-prescribed 14 days of empiric treatment with Ciprofloxacin 500 mg BID. Attempted to call patient from my personal cell phone only receive voicemail, left a detailed message and advised him to start medication asap and return for follow-up in 3 weeks.

## 2017-06-10 NOTE — Telephone Encounter (Signed)
Mr. John Hayden, a 66 year old male with a history of diarrhea  Was evaluated in primary care on 06/04/2017. A stool sample was submitted due to persistent diarrhea. Shigella was detected by PCR testing. Patient's primary provider Joaquin CourtsKimberly Harris, FNP was notified of results.    John NationsLaChina Moore Oluwateniola Leitch  MSN, FNP-C [facility] [address]  [place], [place] [address] [phone]

## 2017-06-10 NOTE — Telephone Encounter (Signed)
Received a critical lab patient is positive for Shingella. E-prescribed 14 days of empiric treatment with Ciprofloxacin 500 mg BID. Attempted to call patient from my personal cell phone only receive voicemail, left a detailed message and advised him to start medication asap and return for follow-up in 3 weeks.   Godfrey PickKimberly S. Tiburcio PeaHarris, MSN, FNP-C [facility]  [address] [place], [place] [address] [phone]

## 2017-06-11 ENCOUNTER — Telehealth: Payer: Self-pay | Admitting: Family Medicine

## 2017-06-11 NOTE — Telephone Encounter (Signed)
Karren BurlyChandra,  Please cancel lab appointment for patient 06/15/17, this was scheduled prior to him coming in for a sick visit recently and I've already rechecked his thyroid function.  Thanks, Godfrey PickKimberly S. Tiburcio PeaHarris, MSN, FNP-C [facility]  [address] [place], [place] [address] [phone]

## 2017-06-15 ENCOUNTER — Other Ambulatory Visit: Payer: Self-pay | Admitting: *Deleted

## 2017-06-15 MED ORDER — SAXAGLIPTIN HCL 5 MG PO TABS: 5.0000 mg | ORAL_TABLET | Freq: Every day | ORAL | 3 refills | Status: DC

## 2017-06-15 NOTE — Telephone Encounter (Signed)
PRINTED FOR PASS PROGRAM 

## 2017-06-18 ENCOUNTER — Other Ambulatory Visit: Payer: No Typology Code available for payment source

## 2017-06-23 MED ORDER — SAXAGLIPTIN HCL 5 MG PO TABS: 5.0000 mg | ORAL_TABLET | Freq: Every day | ORAL | 1 refills | Status: DC

## 2017-06-23 NOTE — Telephone Encounter (Signed)
Onglyza refilled x 90 days

## 2017-06-23 NOTE — Telephone Encounter (Signed)
Patient would [facility] a [facility] Patient would like a 90 day supply as he is going out of country

## 2017-06-28 ENCOUNTER — Encounter: Payer: Self-pay | Admitting: Family Medicine

## 2017-06-28 ENCOUNTER — Ambulatory Visit (INDEPENDENT_AMBULATORY_CARE_PROVIDER_SITE_OTHER): Payer: No Typology Code available for payment source | Admitting: Family Medicine

## 2017-06-28 VITALS — BP 160/80 | HR 68 | Temp 98.2°F | Ht 64.0 in | Wt 179.0 lb

## 2017-06-28 DIAGNOSIS — E119 Type 2 diabetes mellitus without complications: Secondary | ICD-10-CM

## 2017-06-28 DIAGNOSIS — Z7184 Encounter for health counseling related to travel: Secondary | ICD-10-CM

## 2017-06-28 DIAGNOSIS — Z7189 Other specified counseling: Secondary | ICD-10-CM

## 2017-06-28 MED ORDER — HYDROXYCHLOROQUINE SULFATE 200 MG PO TABS: 400.0000 mg | ORAL_TABLET | ORAL | 0 refills | Status: DC

## 2017-06-28 MED ORDER — TYPHOID VACCINE PO CPDR: 1.0000 | DELAYED_RELEASE_CAPSULE | ORAL | 0 refills | Status: DC

## 2017-06-28 MED ORDER — LISINOPRIL 20 MG PO TABS: 20.0000 mg | ORAL_TABLET | Freq: Every day | ORAL | 3 refills | Status: DC

## 2017-06-28 MED ORDER — METFORMIN HCL 500 MG PO TABS: 500.0000 mg | ORAL_TABLET | Freq: Two times a day (BID) | ORAL | 2 refills | Status: DC

## 2017-06-28 NOTE — Progress Notes (Signed)
Patient ID: John Hayden, male    DOB: 1951-04-19, 66 y.o.   MRN: [id number]  PCP: Bing Neighbors, FNP  Chief Complaint  Patient presents with  . Travel Consult    Traveling to [place]  . Medication Problem    ongylza    Subjective:  HPI John Hayden is a 66 y.o. male presents for travel consultation. He will be traveling within 1 week for [place]. According to the CDC, recommendation include malaria prophylaxis, yellow fever, hepatitis B vaccination, and Typhoid fever prophylaxis. John Hayden reports no prior immunization for hepatitis B. He is requesting to resume metformin as he felt his blood sugar was more controlled with metformin compared to onglyza. His concern regarding resuming metformin is diarrhea. He was recently diagnosed and treated for Shingella and reports resolution of diarrhea and abdominal discomfort. John Hayden will remain out of the country for period of 3-6 months and will need medication to extend throughout that period of time. His daughter is present and will remain in the [place] while he travels and confirms that she will be able to refill medications and send medications to him if his medication supply become low while in [place]. Social History   Social History  . Marital status: Married    Spouse name: [name]  . Number of children: [name]  . Years of education: [name]   Occupational History  . Not on file.   Social History Main Topics  . Smoking status: Former Games developer  . Smokeless tobacco: Never Used     Comment: 1997  . Alcohol use No  . Drug use: No  . Sexual activity: Not Currently   Other Topics Concern  . Not on file   Social History Narrative   Both parents are deceased, his mother had heart problems but his father did not. No siblings have heart problems. He is a retired Nurse, learning disability.   History reviewed. No pertinent family history. Review of Systems See HPI Patient Active Problem List   Diagnosis Date Noted  . Hypertension  03/30/2012  . Mild AI (aortic insufficiency) 03/30/2012  . Thrombocytopenia (HCC) 03/30/2012  . Chest pain 03/14/2012    Allergies  Allergen Reactions  . Doxycycline Itching    Prior to Admission medications   Medication Sig Start Date End Date Taking? Authorizing Provider  amLODipine (NORVASC) 5 MG tablet Take 1 tablet (5 mg total) by mouth daily. 04/12/17  Yes Bing Neighbors, FNP  aspirin EC 81 MG tablet Take 1 tablet (81 mg total) by mouth daily. Reported on 02/24/2016 04/12/17  Yes Bing Neighbors, FNP  dicyclomine (BENTYL) 10 MG capsule Take 1 capsule (10 mg total) by mouth 4 (four) times daily -  before meals and at bedtime. 04/12/17  Yes Bing Neighbors, FNP  lisinopril (PRINIVIL,ZESTRIL) 20 MG tablet Take 1 tablet (20 mg total) by mouth daily. 04/12/17  Yes Bing Neighbors, FNP  meloxicam (MOBIC) 15 MG tablet Take 0.5 tablets (7.5 mg total) by mouth daily as needed for pain. 04/12/17  Yes Bing Neighbors, FNP  omeprazole (PRILOSEC) 40 MG capsule Take 1 capsule (40 mg total) by mouth daily. Reported on 02/24/2016 04/12/17  Yes Bing Neighbors, FNP  pravastatin (PRAVACHOL) 40 MG tablet Take 1 tablet (40 mg total) by mouth daily. 04/12/17  Yes Bing Neighbors, FNP  PRESCRIPTION MEDICATION Take 4 mg by mouth daily. 1 tablet once daily. ( cardox 4 mg. ( for prostate)   Yes [provider]  PRESCRIPTION MEDICATION Take 1 tablet by mouth daily. Reported on 03/13/2016   Yes [provider]  saxagliptin HCl (ONGLYZA) 5 MG TABS tablet Take 1 tablet (5 mg total) by mouth daily. 06/23/17  Yes Bing NeighborsHarris, Miraj Truss S, FNP  metFORMIN (GLUCOPHAGE) 500 MG tablet Take 1 tablet (500 mg total) by mouth 2 (two) times daily with a meal. Patient not taking: Reported on 06/28/2017 04/12/17   Bing NeighborsHarris, Shatina Streets S, FNP  saxagliptin HCl (ONGLYZA) 5 MG TABS tablet Take 1 tablet (5 mg total) by mouth daily. Patient not taking: Reported on 06/28/2017 06/15/17   Quentin AngstJegede, Olugbemiga E, MD    Past  Medical, Surgical Family and Social History reviewed and updated.    Objective:   Today's Vitals   06/28/17 0926  BP: (!) 160/80  Pulse: 68  Temp: 98.2 F (36.8 C)  TempSrc: Oral  SpO2: 100%  Weight: 179 lb (81.2 kg)  Height: 5\' 4"  (1.626 m)    Wt Readings from Last 3 Encounters:  06/28/17 179 lb (81.2 kg)  06/03/17 173 lb (78.5 kg)  04/12/17 169 lb (76.7 kg)   Physical Exam  Constitutional: He is oriented to person, place, and time. He appears well-developed and well-nourished.  HENT:  Head: Normocephalic and atraumatic.  Eyes: Pupils are equal, round, and reactive to light. Conjunctivae and EOM are normal.  Neck: Normal range of motion. Neck supple.  Cardiovascular: Normal rate, regular rhythm, normal heart sounds and intact distal pulses.   Pulmonary/Chest: Effort normal and breath sounds normal.  Abdominal: Soft. Bowel sounds are normal. He exhibits no distension and no mass. There is no tenderness. There is no rebound and no guarding.  Musculoskeletal: Normal range of motion.  Neurological: He is alert and oriented to person, place, and time.  Skin: Skin is warm and dry.  Psychiatric: He has a normal mood and affect. His behavior is normal. Judgment and thought content normal.   Assessment & Plan:  1. Encounter for counseling for travel -prescribed malaria and typhoid prophylaxis.  -recommended contacting health department for yellow fever. -Patient will obtain hepatitis B series upon return from travel.  . hydroxychloroquine (PLAQUENIL) 200 MG tablet    Sig: Take 2 tablets (400 mg total) by mouth once a week.    Dispense:  20 tablet    Refill:  0    Order Specific Question:   Supervising Provider    Answer:   Quentin AngstJEGEDE, OLUGBEMIGA E [id number]]  . typhoid (VIVOTIF) DR capsule    Sig: Take 1 capsule by mouth every other day. X 4 doses.    Dispense:  4 capsule    Refill:  0    Order Specific Question:   Supervising Provider    Answer:   Quentin AngstJEGEDE, OLUGBEMIGA E  [id number]]    2. Type 2 diabetes mellitus without complication, without long-term current use of insulin (HCC) -Resume metformin 500 mg BID with meals daily. -If you have difficulty tolerating metformin, please advise and I will resume Onglyza.   RTC: 6 month or upon your return back to the [place].  Godfrey PickKimberly S. Tiburcio PeaHarris, MSN, FNP-C [facility]  [address] [place], [place] [address] [phone]

## 2017-06-28 NOTE — Patient Instructions (Signed)
Take plaquenil 400 mg once weekly for malaria prevention.  Take Vivotif for typhoid fever prevention as prescribed.    Return for care in  6 months    TravelVaccine Information Vaccines, also called immunizations, can protect you from certain diseases. Vaccines can also prevent the spread of certain infections. It is important to see your health care provider or a travel medicine specialist 4-6 weeks before you travel. This allows time for recommended vaccines to take effect. It also provides enough time for you to get vaccines that must be given in a series over a period of days or weeks. Vaccines for travelers include:  Routine vaccines. These vaccines are standard.  Recommended travel vaccines. These vaccines are generally recommended before international travel.  Geographically required travel vaccines. These vaccines are necessary before travel to some countries or regions.  If it is less than 4 weeks before you leave, you should still see your health care provider. You might still benefit from vaccines or medicines. What are routine vaccines? Routine vaccines are shots that can protect you from common diseases in many parts of the world. Most routine vaccines are given at certain ages starting in childhood. Routine vaccines also include the annual flu (influenza) vaccine. It is important that you are up to date on your routine vaccines before you travel. You may be advised to get extra doses, also called booster vaccines, such as the Tdap (tetanus, diphtheria, and pertussis). What are recommended vaccines? Recommended travel vaccines change over time. Your health care provider can tell you what vaccines are recommended before your trip. The most common recommended vaccines before travel are hepatitis A and typhoid vaccines. Know your travel schedule when you visit your health care provider. The vaccines that are recommended before foreign travel will depend on several factors, such  as:  The country or countries of travel.  Whether you will be traveling to rural areas.  How long you will be traveling.  The season of the year.  Your age. Older adults should get a vaccine against a certain type of pneumonia (pneumococcal) and a vaccine against shingles (herpes zoster).  Your health status.  Your previous vaccines.  The annual influenza vaccine sometimes differs for the [place] and [place] hemispheres. You should:  Get both vaccines if you are traveling to the other hemisphere, and you have a chronic medical condition.  Get the vaccine shortly before or during the flu season, and only if the vaccine in your country differs from the vaccine in your destination country.  Get the other influenza vaccine either before leaving the country or shortly after arriving at the destination country.  What are geographically required vaccines? Children should be up to date with all of the recommended vaccinations. Parents should follow the standard vaccination guidelines that are recommended by the pediatrician. Some vaccines may be required during an ongoing outbreak of an infectious disease in a country or region. Your health care provider will be able to tell you about any outbreaks and required vaccines. Some examples of required vaccines include: Yellow fever vaccine  Proof of yellow fever immunization is currently required for most people before traveling to certain countries in [place] and [place].  If proof of immunization is incomplete or inaccurate, you could be quarantined, denied entry, or given another dose of vaccine at the travel site.  This vaccine can only be obtained at approved centers.  You should get the yellow fever vaccine at least 10 days before your trip.  After 10  days, most people show immunity to yellow fever.  If it has been longer than 10 years since you received the yellow fever vaccine, another dose is required.  Meningococcal  vaccine  Meningococcal immunization may be required prior to travel to parts of [place] and [place].  Proof of meningococcal immunization is required by the [place] of Health for any person older than age 50 who is taking part in [place] or [place].  Visas for traveling to take part in hajj or umrah will not even be issued until there is proof of immunization. You should get this vaccine at least 10 days before your trip.  After 10 days, most people show immunity.  If it has been longer than 3 years since your last immunization, another dose is required.  Some travel circumstances may require additional vaccination with the following vaccines:  Hepatitis B.  Rabies.  Tick-borne encephalitis.  Malaria.  Where to find more information:  Centers for Disease Control and Prevention (CDC): FootballExhibition.com.brwww.cdc.gov  World Health Organization Guidance Center, The(WHO): https://castaneda-walker.com/www.who.int This information is not intended to replace advice given to you by your health care provider. Make sure you discuss any questions you have with your health care provider. Document Released: 11/11/2009 Document Revised: 08/18/2016 Document Reviewed: 04/28/2016 Elsevier Interactive Patient Education  [employer]2018 Elsevier Inc.

## 2017-06-30 DIAGNOSIS — E119 Type 2 diabetes mellitus without complications: Secondary | ICD-10-CM | POA: Insufficient documentation

## 2017-07-05 ENCOUNTER — Ambulatory Visit: Payer: No Typology Code available for payment source | Admitting: Family Medicine

## 2017-10-14 ENCOUNTER — Ambulatory Visit: Payer: No Typology Code available for payment source | Admitting: Family Medicine

## 2017-12-24 ENCOUNTER — Encounter: Payer: Self-pay | Admitting: Family Medicine

## 2018-01-03 ENCOUNTER — Ambulatory Visit: Payer: No Typology Code available for payment source | Admitting: Family Medicine

## 2018-01-04 ENCOUNTER — Ambulatory Visit: Payer: No Typology Code available for payment source | Attending: Internal Medicine

## 2018-01-07 ENCOUNTER — Ambulatory Visit (INDEPENDENT_AMBULATORY_CARE_PROVIDER_SITE_OTHER): Payer: Self-pay | Admitting: Family Medicine

## 2018-01-07 ENCOUNTER — Encounter: Payer: Self-pay | Admitting: Family Medicine

## 2018-01-07 VITALS — BP 140/68 | HR 86 | Temp 98.8°F | Ht 64.0 in | Wt 179.0 lb

## 2018-01-07 DIAGNOSIS — Z1211 Encounter for screening for malignant neoplasm of colon: Secondary | ICD-10-CM

## 2018-01-07 DIAGNOSIS — E039 Hypothyroidism, unspecified: Secondary | ICD-10-CM

## 2018-01-07 DIAGNOSIS — E119 Type 2 diabetes mellitus without complications: Secondary | ICD-10-CM

## 2018-01-07 LAB — POCT URINALYSIS DIP (DEVICE)
Bilirubin Urine: NEGATIVE
GLUCOSE, UA: NEGATIVE mg/dL
Hgb urine dipstick: NEGATIVE
LEUKOCYTES UA: NEGATIVE
Nitrite: NEGATIVE
Protein, ur: NEGATIVE mg/dL
SPECIFIC GRAVITY, URINE: 1.02 (ref 1.005–1.030)
Urobilinogen, UA: 0.2 mg/dL (ref 0.0–1.0)
pH: 7 (ref 5.0–8.0)

## 2018-01-07 LAB — POCT GLYCOSYLATED HEMOGLOBIN (HGB A1C): HEMOGLOBIN A1C: 7

## 2018-01-07 MED ORDER — OMEPRAZOLE 40 MG PO CPDR: 40.0000 mg | DELAYED_RELEASE_CAPSULE | Freq: Every day | ORAL | 1 refills | Status: DC

## 2018-01-07 MED ORDER — CYCLOBENZAPRINE HCL 10 MG PO TABS: 10.0000 mg | ORAL_TABLET | Freq: Three times a day (TID) | ORAL | 0 refills | Status: DC | PRN

## 2018-01-07 MED ORDER — AMLODIPINE BESYLATE 10 MG PO TABS: 10.0000 mg | ORAL_TABLET | Freq: Every day | ORAL | 1 refills | Status: DC

## 2018-01-07 MED ORDER — LEVOTHYROXINE SODIUM 25 MCG PO TABS: 25.0000 ug | ORAL_TABLET | Freq: Every day | ORAL | 0 refills | Status: DC

## 2018-01-07 NOTE — Progress Notes (Signed)
Patient ID: John Hayden, male    DOB: 1951-05-14, 67 y.o.   MRN: [id number]  PCP: Bing Neighbors, FNP  Chief Complaint  Patient presents with  . Follow-up    6 month on rountine care    Subjective:  HPI-interpreter services used during visit. John Hayden is a 67 y.o. male with hypertension, type 2 diabetes, abnormal elevated TSH, presents to for chronic condition management. Patient returned only recently from a six month stay abroad in [place]. He reports compliance with all medications during his travels. He routinely checked his blood sugar and consistently obtained readings no greater than 120. He currently managed on metformin 500 mg twice daily. His A1C was check while in [place] and he obtained a reading of 7.0. Prior to returning to returning to the th [place] he had all routine labs completed and brings a copy of results with him into office today. Occasionally check BP while in [place] and readings were consistent ly below 140/90. He denies any illness during his travels. Today denies chest pain, shortness of breath, dizziness, headache, or new weakness. Requests refills of all chronic medications today. Social History   Socioeconomic History  . Marital status: Married    Spouse name: Not on file  . Number of children: Not on file  . Years of education: Not on file  . Highest education level: Not on file  Social Needs  . Financial resource strain: Not on file  . Food insecurity - worry: Not on file  . Food insecurity - inability: Not on file  . Transportation needs - medical: Not on file  . Transportation needs - non-medical: Not on file  Occupational History  . Not on file  Tobacco Use  . Smoking status: Former Games developer  . Smokeless tobacco: Never Used  . Tobacco comment: 1997  Substance and Sexual Activity  . Alcohol use: No  . Drug use: No  . Sexual activity: Not Currently  Other Topics Concern  . Not on file  Social History Narrative   Both parents are  deceased, his mother had heart problems but his father did not. No siblings have heart problems. He is a retired Nurse, learning disability.    Family History  Problem Relation Age of Onset  . Diabetes Mother    Review of Systems Constitutional: Negative for fever, chills, diaphoresis, activity change, appetite change and fatigue. HENT: Negative for ear pain, nosebleeds, congestion, facial swelling, rhinorrhea, neck pain, neck stiffness and ear discharge.  Eyes: Negative for pain, discharge, redness, itching and visual disturbance. Respiratory: Negative for cough, choking, chest tightness, shortness of breath, wheezing and stridor.  Cardiovascular: Negative for chest pain, palpitations and leg swelling. Gastrointestinal: Negative for abdominal distention. Genitourinary: Negative for dysuria, urgency, frequency, hematuria, flank pain, decreased urine volume, difficulty urinating and dyspareunia.  Musculoskeletal: Negative for back pain, joint swelling, arthralgia and gait problem. Neurological: Negative for dizziness, tremors, seizures, syncope, facial asymmetry, speech difficulty, weakness, light-headedness, numbness and headaches.  Hematological: Negative for adenopathy. Does not bruise/bleed easily. Psychiatric/Behavioral: Negative for hallucinations, behavioral problems, confusion, dysphoric mood, decreased concentration and agitation.  Patient Active Problem List   Diagnosis Date Noted  . T2DM (type 2 diabetes mellitus) (HCC) 06/30/2017  . Hypertension 03/30/2012  . Mild AI (aortic insufficiency) 03/30/2012  . Thrombocytopenia (HCC) 03/30/2012  . Chest pain 03/14/2012    Allergies  Allergen Reactions  . Doxycycline Itching    Prior to Admission medications   Medication Sig Start Date End Date Taking? Authorizing Provider  amLODipine (NORVASC) 5 MG tablet Take 1 tablet (5 mg total) by mouth daily. Patient taking differently: Take 10 mg by mouth daily.  04/12/17  Yes Bing NeighborsHarris, Yancy Hascall S, FNP   lisinopril (PRINIVIL,ZESTRIL) 20 MG tablet Take 1 tablet (20 mg total) by mouth daily. 06/28/17  Yes Bing NeighborsHarris, Cordie Beazley S, FNP  metFORMIN (GLUCOPHAGE) 500 MG tablet Take 1 tablet (500 mg total) by mouth 2 (two) times daily with a meal. 06/28/17  Yes Bing NeighborsHarris, Nicki Gracy S, FNP  aspirin EC 81 MG tablet Take 1 tablet (81 mg total) by mouth daily. Reported on 02/24/2016 Patient not taking: Reported on 01/07/2018 04/12/17   Bing NeighborsHarris, Burgess Sheriff S, FNP  dicyclomine (BENTYL) 10 MG capsule Take 1 capsule (10 mg total) by mouth 4 (four) times daily -  before meals and at bedtime. Patient not taking: Reported on 01/07/2018 04/12/17   Bing NeighborsHarris, Korena Nass S, FNP  hydroxychloroquine (PLAQUENIL) 200 MG tablet Take 2 tablets (400 mg total) by mouth once a week. Patient not taking: Reported on 01/07/2018 06/28/17   Bing NeighborsHarris, Ahmiyah Coil S, FNP  meloxicam (MOBIC) 15 MG tablet Take 0.5 tablets (7.5 mg total) by mouth daily as needed for pain. Patient not taking: Reported on 01/07/2018 04/12/17   Bing NeighborsHarris, Brandun Pinn S, FNP  omeprazole (PRILOSEC) 40 MG capsule Take 1 capsule (40 mg total) by mouth daily. Reported on 02/24/2016 Patient not taking: Reported on 01/07/2018 04/12/17   Bing NeighborsHarris, Pruitt Taboada S, FNP  pravastatin (PRAVACHOL) 40 MG tablet Take 1 tablet (40 mg total) by mouth daily. Patient not taking: Reported on 01/07/2018 04/12/17   Bing NeighborsHarris, Shneur Whittenburg S, FNP  PRESCRIPTION MEDICATION Take 4 mg by mouth daily. 1 tablet once daily. ( cardox 4 mg. ( for prostate)    [provider]  PRESCRIPTION MEDICATION Take 1 tablet by mouth daily. Reported on 03/13/2016    [provider]  typhoid (VIVOTIF) DR capsule Take 1 capsule by mouth every other day. X 4 doses. Patient not taking: Reported on 01/07/2018 06/28/17   Bing NeighborsHarris, Donell Tomkins S, FNP    Past Medical, Surgical Family and Social History reviewed and updated.    Objective:   Today's Vitals   01/07/18 1444  BP: 140/68  Pulse: 86  Temp: 98.8 F (37.1 C)  TempSrc: Oral  SpO2: 100%  Weight:  179 lb (81.2 kg)  Height: 5\' 4"  (1.626 m)    Wt Readings from Last 3 Encounters:  01/07/18 179 lb (81.2 kg)  06/28/17 179 lb (81.2 kg)  06/03/17 173 lb (78.5 kg)   Physical Exam Constitutional: Patient appears well-developed and well-nourished. No distress. HENT: Normocephalic, atraumatic, External right and left ear normal. Oropharynx is clear and moist.  Eyes: Conjunctivae and EOM are normal. PERRLA, no scleral icterus. Neck: Normal ROM. Neck supple. No JVD. No tracheal deviation. No thyromegaly. CVS: RRR, S1/S2 +, no murmurs, no gallops, no carotid bruit.  Pulmonary: Effort and breath sounds normal, no stridor, rhonchi, wheezes, rales.  Abdominal: Soft. BS +, no distension, tenderness, rebound or guarding.  Musculoskeletal: Normal range of motion. No edema and no tenderness.  Lymphadenopathy: No lymphadenopathy noted, cervical, inguinal or axillary Neuro: Alert. Normal reflexes, muscle tone coordination. No cranial nerve deficit. Skin: Skin is warm and dry. No rash noted. Not diaphoretic. No erythema. No pallor. Psychiatric: Normal mood and affect. Behavior, judgment, thought content normal.  Assessment & Plan:  1. Type 2 diabetes mellitus without complication, without long-term current use of insulin (HCC), 7.0, no medication changes today. Encouraged engagement in brisk walking in 15-20 minute intervals  as tolerated to facilitate improved glycemic control. Overdue for diabetic eye exam placing a referral to Ophthalmology.  2. Hypothyroidism, unspecified type, due to recent labs collected less than 7 days ago, TSH level was normal.  Will continue to monitor TSH levels at subsequent visits.  Levothyroxine 25 mcg once daily.  3. Encounter for screening colonoscopy- Ambulatory referral to Gastroenterology, has [facility] financial assistance and is overdue for colon cancer screening.   Meds ordered this encounter  Medications  . amLODipine (NORVASC) 10 MG tablet    Sig: Take 1  tablet (10 mg total) by mouth daily.    Dispense:  90 tablet    Refill:  1    Order Specific Question:   Supervising Provider    Answer:   Quentin Angst [id number]  . omeprazole (PRILOSEC) 40 MG capsule    Sig: Take 1 capsule (40 mg total) by mouth daily. Reported on 02/24/2016    Dispense:  90 capsule    Refill:  1    Order Specific Question:   Supervising Provider    Answer:   Quentin Angst [id number]  . levothyroxine (SYNTHROID, LEVOTHROID) 25 MCG tablet    Sig: Take 1 tablet (25 mcg total) by mouth daily before breakfast.    Dispense:  90 tablet    Refill:  0    Order Specific Question:   Supervising Provider    Answer:   Quentin Angst [id number]  . cyclobenzaprine (FLEXERIL) 10 MG tablet    Sig: Take 1 tablet (10 mg total) by mouth 3 (three) times daily as needed for muscle spasms.    Dispense:  30 tablet    Refill:  0    Order Specific Question:   Supervising Provider    Answer:   Quentin Angst [id number]    Orders Placed This Encounter  Procedures  . Ambulatory referral to Gastroenterology  . Ambulatory referral to Ophthalmology  . POCT glycosylated hemoglobin (Hb A1C)  . POCT urinalysis dip (device)    Reviewed all lab results brought to the office today and results were consistent with his normal baseline.  Copy of lab results sent to scan.  A total of 35 minutes spent, greater than 50 % of this time was spent utilizing an interpreter, reviewingprior medical history, reviewing medications and indications of treatment, prior labs and diagnostic tests, discussing current plan of treatment, health promotion, and goals of treatment.  Godfrey Pick. Tiburcio Pea, MSN, FNP-C [facility]  [address] [place], [place] [address] [phone]

## 2018-01-07 NOTE — Patient Instructions (Addendum)
J'ai rempli son omprazole.  Je vous rfre  la gastro-entrologie pour un dpistage du cancer du clon. Ils vous contacteront pour prendre rendez-vous.  Je vous recommande un examen de la vue pour diabtique, vous serez contact par [employer] [facility] [employer] yeux et la rtine pour diabtiques.  Votre diabte est lgrement au-dessus de l'objectif aujourd'hui, votre A1c tait de 7,0. Je vous encourage  poursuivre vos activits physiques habituelles et  rduire votre consommation d'aliments riches en amidons et en sucres simples.   Comme sa TSH reste constamment leve, je vais commencer par vous prendre de la lvothyroxine, qui est utilise pour traiter l'hypothyrodie. Je voudrais que vous reveniez dans 6 semaines pour un niveau de TSH rpt afin que je puisse valuer l'efficacit du mdicament et de Engineer, materials.  Vous pouvez aller chercher vos [employer] centres de sant et de bien-tre communautaires.    J'ai prescrit de la cyclobenzaprine pour soulager votre douleur au cou, prenez 1 comprim 3 fois par jour, au besoin. appliquez des compresses chaudes deux fois par jour pour [employer].     I have refilled her omeprazole.  I am referring you to gastroenterology for a colon cancer screening.  They will contact you to schedule your appointment.  I am referring you for diabetic eye exam, you will be contacted by the diabetic eye and retina center.  Your diabetes is slightly above goal today your A1c was 7.0.  I encourage you to continue to engage in routine physical activity and reduce intake of foods rich in starches and simple sugars.   As her TSH has persistently remain elevated I will go ahead and start you on levothyroxine which is used to treat hypothyroidism.  I would like for you to return in 6 weeks for a repeat TSH level in order for me to evaluate the effectiveness of the medication and placing you on.  You [facility] your [facility] at [facility]  I have prescribed cyclobenzaprine for your neck pain, take 1 tablet , 3 times daily as needed. apply warm compresses twice daily to relieve pain.  Hypothyroidism Hypothyroidism is a disorder of the thyroid. The thyroid is a large gland that is located in the lower front of the neck. The thyroid releases hormones that control how the body works. With hypothyroidism, the thyroid does not make enough of these hormones. What are the causes? Causes of hypothyroidism may include:  Viral infections.  Pregnancy.  Your own defense system (immune system) attacking your thyroid.  Certain medicines.  Birth defects.  Past radiation treatments to your head or neck.  Past treatment with radioactive iodine.  Past surgical removal of part or all of your thyroid.  Problems with the gland that is located in the center of your brain (pituitary).  What are the signs or symptoms? Signs and symptoms of hypothyroidism may include:  Feeling as though you have no energy (lethargy).  Inability to tolerate cold.  Weight gain that is not explained by a change in diet or exercise habits.  Dry skin.  Coarse hair.  Menstrual irregularity.  Slowing of thought processes.  Constipation.  Sadness or depression.  How is this diagnosed? Your health care provider may diagnose hypothyroidism with blood tests and ultrasound tests. How is this treated? Hypothyroidism is treated with medicine that replaces the hormones that your body does not make. After you begin treatment, it may take several weeks for symptoms to go away. Follow these instructions at home:  Take medicines only as directed by your health care provider.  If you start taking any new medicines, tell your health care provider.  Keep all follow-up visits as directed by your health care provider. This is important. As your condition improves, your dosage needs may change. You will need to have blood tests regularly  so that your health care provider can watch your condition. Contact a health care provider if:  Your symptoms do not get better with treatment.  You are taking thyroid replacement medicine and: ? You sweat excessively. ? You have tremors. ? You feel anxious. ? You lose weight rapidly. ? You cannot tolerate heat. ? You have emotional swings. ? You have diarrhea. ? You feel weak. Get help right away if:  You develop chest pain.  You develop an irregular heartbeat.  You develop a rapid heartbeat. This information is not intended to replace advice given to you by your health care provider. Make sure you discuss any questions you have with your health care provider. Document Released: 11/23/2005 Document Revised: 04/30/2016 Document Reviewed: 04/10/2014 Elsevier Interactive Patient Education  2018 [employer]

## 2018-01-18 ENCOUNTER — Encounter: Payer: Self-pay | Admitting: Gastroenterology

## 2018-01-20 ENCOUNTER — Telehealth: Payer: Self-pay

## 2018-01-20 ENCOUNTER — Encounter (HOSPITAL_COMMUNITY): Payer: Self-pay | Admitting: Emergency Medicine

## 2018-01-20 ENCOUNTER — Other Ambulatory Visit: Payer: Self-pay

## 2018-01-20 ENCOUNTER — Emergency Department (HOSPITAL_COMMUNITY): Payer: Self-pay

## 2018-01-20 DIAGNOSIS — E119 Type 2 diabetes mellitus without complications: Secondary | ICD-10-CM | POA: Insufficient documentation

## 2018-01-20 DIAGNOSIS — Z79899 Other long term (current) drug therapy: Secondary | ICD-10-CM | POA: Insufficient documentation

## 2018-01-20 DIAGNOSIS — Z7984 Long term (current) use of oral hypoglycemic drugs: Secondary | ICD-10-CM | POA: Insufficient documentation

## 2018-01-20 DIAGNOSIS — R072 Precordial pain: Secondary | ICD-10-CM | POA: Insufficient documentation

## 2018-01-20 DIAGNOSIS — Z87891 Personal history of nicotine dependence: Secondary | ICD-10-CM | POA: Insufficient documentation

## 2018-01-20 DIAGNOSIS — I10 Essential (primary) hypertension: Secondary | ICD-10-CM | POA: Insufficient documentation

## 2018-01-20 LAB — I-STAT TROPONIN, ED: Troponin i, poc: 0 ng/mL (ref 0.00–0.08)

## 2018-01-20 LAB — BASIC METABOLIC PANEL
Anion gap: 9 (ref 5–15)
BUN: 17 mg/dL (ref 6–20)
CHLORIDE: 106 mmol/L (ref 101–111)
CO2: 24 mmol/L (ref 22–32)
Calcium: 9.2 mg/dL (ref 8.9–10.3)
Creatinine, Ser: 1.26 mg/dL — ABNORMAL HIGH (ref 0.61–1.24)
GFR calc Af Amer: >60 mL/min (ref >60)
GFR calc non Af Amer: 57 mL/min — ABNORMAL LOW (ref >60)
GLUCOSE: 159 mg/dL — AB (ref 65–99)
POTASSIUM: 4.1 mmol/L (ref 3.5–5.1)
Sodium: 139 mmol/L (ref 135–145)

## 2018-01-20 LAB — CBC
HEMATOCRIT: 42.4 % (ref 39.0–52.0)
Hemoglobin: 13.8 g/dL (ref 13.0–17.0)
MCH: 27.2 pg (ref 26.0–34.0)
MCHC: 32.5 g/dL (ref 30.0–36.0)
MCV: 83.6 fL (ref 78.0–100.0)
Platelets: 127 10*3/uL — ABNORMAL LOW (ref 150–400)
RBC: 5.07 MIL/uL (ref 4.22–5.81)
RDW: 13.8 % (ref 11.5–15.5)
WBC: 5.8 10*3/uL (ref 4.0–10.5)

## 2018-01-20 MED ORDER — AMLODIPINE BESYLATE 10 MG PO TABS: 10.0000 mg | ORAL_TABLET | Freq: Every day | ORAL | 1 refills | Status: DC

## 2018-01-20 NOTE — ED Provider Notes (Signed)
Patient placed in Quick Look pathway, seen and evaluated   Chief Complaint: Right chest pain  HPI:  Patient has had right sided sharp and stabbing chest pain for the past three weeks now. Worsened with movement. Denies recent injury. Denies associated shortness of breath.  ROS: No SOB  Physical Exam:   Gen: No distress  Neuro: Awake and Alert  Skin: Warm    Focused Exam: Lungs CTA. Right chest wall non-tender to palpation, no bruising, rash or erythema appreciated.    Initiation of care has begun. The patient has been counseled on the process, plan, and necessity for staying for the completion/evaluation, and the remainder of the medical screening examination    Lawrence MarseillesShrosbree, Anhar Mcdermott J, PA-C 01/20/18 2120    Mancel BaleWentz, Elliott, MD 01/21/18 1046

## 2018-01-20 NOTE — Telephone Encounter (Signed)
Patient called and is asking for a refill on onglyza. I don't see this on current medication list. Please advise if he should still be taken this. Thanks!

## 2018-01-20 NOTE — ED Triage Notes (Signed)
Patient with 3 week history of right sided flank and chest pain.  Patient denies any nausea or vomiting.  Patient states that it is more in the axilla area on the right.  No shortness of breath.

## 2018-01-21 ENCOUNTER — Emergency Department (HOSPITAL_COMMUNITY)
Admission: EM | Admit: 2018-01-21 | Discharge: 2018-01-21 | Disposition: A | Discharge status: Home / Self Care | Payer: Self-pay | Attending: Emergency Medicine | Admitting: Emergency Medicine

## 2018-01-21 ENCOUNTER — Encounter (HOSPITAL_COMMUNITY): Payer: Self-pay | Admitting: Radiology

## 2018-01-21 ENCOUNTER — Emergency Department (HOSPITAL_COMMUNITY): Payer: Self-pay

## 2018-01-21 DIAGNOSIS — R072 Precordial pain: Secondary | ICD-10-CM

## 2018-01-21 LAB — I-STAT TROPONIN, ED: Troponin i, poc: 0 ng/mL (ref 0.00–0.08)

## 2018-01-21 MED ORDER — KETOROLAC TROMETHAMINE 30 MG/ML IJ SOLN
15.0000 mg | Freq: Once | INTRAMUSCULAR | Status: AC
  Administered 2018-01-21: 15 mg via INTRAVENOUS
  Filled 2018-01-21: qty 1

## 2018-01-21 MED ORDER — IOPAMIDOL (ISOVUE-370) INJECTION 76%
INTRAVENOUS | Status: AC
  Administered 2018-01-21: 100 mL
  Filled 2018-01-21: qty 100

## 2018-01-21 NOTE — Discharge Instructions (Signed)
Will need an outpatient CT scan in 6 months to assess stability of pulmonary and adrenal module seen on CT scan please take this paperwork to your doctor to order it

## 2018-01-21 NOTE — ED Provider Notes (Signed)
[facility] EMERGENCY DEPARTMENT Provider Note   CSN: [id number] Arrival date & time: 01/20/18  2055     History   Chief Complaint Chief Complaint  Patient presents with  . Chest Pain    HPI John Hayden is a 67 y.o. male.  The history is provided by the patient.  Chest Pain   This is a chronic problem. The current episode started more than 1 week ago. The problem occurs hourly. The problem has not changed since onset.The pain is associated with rest. The pain is present in the lateral region (right lateral ). The pain is severe. The quality of the pain is described as sharp. The pain does not radiate. Exacerbated by: unale to state. Pertinent negatives include no abdominal pain, no back pain, no claudication, no cough, no diaphoresis, no dizziness, no exertional chest pressure, no fever, no headaches, no hemoptysis, no irregular heartbeat, no leg pain, no lower extremity edema, no malaise/fatigue, no nausea, no near-syncope, no numbness, no orthopnea, no palpitations, no PND, no shortness of breath, no sputum production, no syncope, no vomiting and no weakness. He has tried nothing for the symptoms. The treatment provided no relief. Risk factors include being elderly.  Pertinent negatives for past medical history include no aneurysm and no MI.  Pertinent negatives for family medical history include: no Marfan's syndrome.  Procedure history is negative for cardiac catheterization.    Past Medical History:  Diagnosis Date  . Chest pain    echo 4/13: EF 50-55%, mild AI;  Myoview 4/13: EF 59%, no ischemia  . Diabetes mellitus    borderline diabetic controlled by diet   . GERD (gastroesophageal reflux disease)   . Hypertension   . Mild aortic insufficiency   . Thrombocytopenia [facility])     Patient Active Problem List   Diagnosis Date Noted  . T2DM (type 2 diabetes mellitus) (HCC) 06/30/2017  . Hypertension 03/30/2012  . Mild AI (aortic insufficiency)  03/30/2012  . Thrombocytopenia (HCC) 03/30/2012  . Chest pain 03/14/2012    Past Surgical History:  Procedure Laterality Date  . hernia     inguinal and abdominal  . HERNIA REPAIR    . LEFT HEART CATHETERIZATION WITH CORONARY ANGIOGRAM [name] 03/14/2012   Procedure: LEFT HEART CATHETERIZATION WITH CORONARY ANGIOGRAM;  Surgeon: Peter M Swazil[place], MD;  Location: [facility] CATH LAB;  Service: Cardiovascular;  Laterality: [name];       Home Medications    Prior to Admission medications   Medication Sig Start Date End Date Taking? Authorizing Provider  amLODipine (NORVASC) 10 MG tablet Take 1 tablet (10 mg total) by mouth daily. 01/20/18   Bing Neighbors, FNP  aspirin EC 81 MG tablet Take 1 tablet (81 mg total) by mouth daily. Reported on 02/24/2016 Patient not taking: Reported on 01/07/2018 04/12/17   Bing Neighbors, FNP  cyclobenzaprine (FLEXERIL) 10 MG tablet Take 1 tablet (10 mg total) by mouth 3 (three) times daily as needed for muscle spasms. 01/07/18   Bing Neighbors, FNP  levothyroxine (SYNTHROID, LEVOTHROID) 25 MCG tablet Take 1 tablet (25 mcg total) by mouth daily before breakfast. 01/07/18   Bing Neighbors, FNP  lisinopril (PRINIVIL,ZESTRIL) 20 MG tablet Take 1 tablet (20 mg total) by mouth daily. 06/28/17   Bing Neighbors, FNP  meloxicam (MOBIC) 15 MG tablet Take 0.5 tablets (7.5 mg total) by mouth daily as needed for pain. Patient not taking: Reported on 01/07/2018 04/12/17   Bing Neighbors, FNP  metFORMIN (GLUCOPHAGE)  500 MG tablet Take 1 tablet (500 mg total) by mouth 2 (two) times daily with a meal. 06/28/17   Bing Neighbors, FNP  omeprazole (PRILOSEC) 40 MG capsule Take 1 capsule (40 mg total) by mouth daily. Reported on 02/24/2016 01/07/18   Bing Neighbors, FNP  pravastatin (PRAVACHOL) 40 MG tablet Take 1 tablet (40 mg total) by mouth daily. Patient not taking: Reported on 01/07/2018 04/12/17   Bing Neighbors, FNP  PRESCRIPTION MEDICATION Take 4 mg by mouth daily. 1 tablet  once daily. ( cardox 4 mg. ( for prostate)    [provider]  PRESCRIPTION MEDICATION Take 1 tablet by mouth daily. Reported on 03/13/2016    [provider]    Family History Family History  Problem Relation Age of Onset  . Diabetes Mother     Social History Social History   Tobacco Use  . Smoking status: Former Games developer  . Smokeless tobacco: Never Used  . Tobacco comment: 1997  Substance Use Topics  . Alcohol use: No  . Drug use: No     Allergies   Doxycycline   Review of Systems Review of Systems  Constitutional: Negative for diaphoresis, fever and malaise/fatigue.  Respiratory: Negative for cough, hemoptysis, sputum production and shortness of breath.   Cardiovascular: Positive for chest pain. Negative for palpitations, orthopnea, claudication, leg swelling, syncope, PND and near-syncope.  Gastrointestinal: Negative for abdominal pain, nausea and vomiting.  Musculoskeletal: Negative for back pain.  Neurological: Negative for dizziness, weakness, numbness and headaches.  All other systems reviewed and are negative.    Physical Exam Updated Vital Signs BP (!) 147/92 (BP Location: Right Arm)   Pulse 77   Temp 98.1 F (36.7 C) (Oral)   Resp (!) 23   SpO2 100%   Physical Exam  Constitutional: He is oriented to person, place, and time. He appears well-developed and well-nourished. No distress.  HENT:  Head: Normocephalic and atraumatic.  Nose: Nose normal.  Mouth/Throat: No oropharyngeal exudate.  Eyes: Conjunctivae are normal. Pupils are equal, round, and reactive to light.  Neck: Normal range of motion. Neck supple.  Cardiovascular: Normal rate, regular rhythm, normal heart sounds and intact distal pulses.  Pulmonary/Chest: Effort normal and breath sounds normal. No stridor. He has no wheezes. He has no rales.  Abdominal: Soft. Bowel sounds are normal. He exhibits no mass. There is no tenderness. There is no rebound and no guarding.    Musculoskeletal: Normal range of motion. He exhibits no edema.  Neurological: He is alert and oriented to person, place, and time. He displays normal reflexes.  Skin: Skin is warm and dry. Capillary refill takes less than 2 seconds.  Psychiatric: He has a normal mood and affect.     ED Treatments / Results  Labs (all labs ordered are listed, but only abnormal results are displayed)  Results for orders placed or performed during the hospital encounter of 01/21/18  Basic metabolic panel  Result Value Ref Range   Sodium 139 135 - 145 mmol/L   Potassium 4.1 3.5 - 5.1 mmol/L   Chloride 106 101 - 111 mmol/L   CO2 24 22 - 32 mmol/L   Glucose, Bld 159 (H) 65 - 99 mg/dL   BUN 17 6 - 20 mg/dL   Creatinine, Ser 9.60 (H) 0.61 - 1.24 mg/dL   Calcium 9.2 8.9 - 45.4 mg/dL   GFR calc non Af Amer 57 (L) >60 mL/min   GFR calc Af Amer >60 >60 mL/min  Anion gap 9 5 - 15  CBC  Result Value Ref Range   WBC 5.8 4.0 - 10.5 K/uL   RBC 5.07 4.22 - 5.81 MIL/uL   Hemoglobin 13.8 13.0 - 17.0 g/dL   HCT 16.1 09.6 - 04.5 %   MCV 83.6 78.0 - 100.0 fL   MCH 27.2 26.0 - 34.0 pg   MCHC 32.5 30.0 - 36.0 g/dL   RDW 40.9 81.1 - 91.4 %   Platelets 127 (L) 150 - 400 K/uL  I-stat troponin, ED  Result Value Ref Range   Troponin i, poc 0.00 0.00 - 0.08 ng/mL   Comment 3          I-stat troponin, ED  Result Value Ref Range   Troponin i, poc 0.00 0.00 - 0.08 ng/mL   Comment 3           Dg Chest 2 View  Result Date: 01/20/2018 CLINICAL DATA:  Right-sided chest pain EXAM: CHEST  2 VIEW COMPARISON:  03/14/2012 FINDINGS: The heart size and mediastinal contours are within normal limits. Both lungs are clear. The visualized skeletal structures are unremarkable. IMPRESSION: No active cardiopulmonary disease. Electronically Signed   By: Alcide Clever M.D.   On: 01/20/2018 21:50   Ct Angio Chest Pe W And/or Wo Contrast  Result Date: 01/21/2018 CLINICAL DATA:  Right-sided chest pain. EXAM: CT ANGIOGRAPHY CHEST WITH  CONTRAST TECHNIQUE: Multidetector CT imaging of the chest was performed using the standard protocol during bolus administration of intravenous contrast. Multiplanar CT image reconstructions and MIPs were obtained to evaluate the vascular anatomy. CONTRAST:  64 cc ISOVUE-370 IOPAMIDOL (ISOVUE-370) INJECTION 76% COMPARISON:  Chest radiograph yesterday FINDINGS: Cardiovascular: There are no filling defects within the pulmonary arteries to suggest pulmonary embolus. The thoracic aorta is normal in caliber. Heart is normal in size. No pericardial effusion. Mediastinum/Nodes: No enlarged mediastinal or hilar lymph nodes. No axillary adenopathy. Small hiatal hernia. No thyroid nodule. Lungs/Pleura: No consolidation, pulmonary edema or pleural fluid. Trachea and mainstem bronchi are patent. 4 mm subpleural left upper lobe nodule image 41 series 10. Upper Abdomen: Simple cyst in the left kidney, partially included. 12 mm left adrenal nodule not definitively adrenal adenoma on the current exam. Musculoskeletal: There are no acute or suspicious osseous abnormalities. Review of the MIP images confirms the above findings. IMPRESSION: 1. No pulmonary embolus.  No acute intrathoracic abnormality. 2. Tiny 4 mm subpleural left upper lobe pulmonary nodule, possibly an intrapulmonary lymph node. No follow-up needed if patient is low-risk. Non-contrast chest CT can be considered in 12 months if patient is high-risk. This recommendation follows the consensus statement: Guidelines for Management of Incidental Pulmonary Nodules Detected on CT Images: From the Fleischner Society 2017; Radiology 2017; 284:228-243. 3. Incidental 12 mm left adrenal nodule in the upper abdomen. In the absence of malignancy history this is probably benign, consider a follow-up adrenal CT in 12 months. If there is history of malignancy, recommend adrenal CT now. 4. Small hiatal hernia. Electronically Signed   By: Rubye Oaks M.D.   On: 01/21/2018 02:01     EKG  EKG Interpretation  Date/Time:  Thursday January 20 2018 21:16:36 EST Ventricular Rate:  79 PR Interval:  136 QRS Duration: 90 QT Interval:  396 QTC Calculation: 454 R Axis:   16 Text Interpretation:  Normal sinus rhythm Left ventricular hypertrophy Confirmed by Dollie Bressi ([id number]) on 01/21/2018 12:35:55 AM       Radiology Dg Chest 2 View  Result Date: 01/20/2018 CLINICAL DATA:  Right-sided chest pain EXAM: CHEST  2 VIEW COMPARISON:  03/14/2012 FINDINGS: The heart size and mediastinal contours are within normal limits. Both lungs are clear. The visualized skeletal structures are unremarkable. IMPRESSION: No active cardiopulmonary disease. Electronically Signed   By: Alcide CleverMark  Lukens M.D.   On: 01/20/2018 21:50   Ct Angio Chest Pe W And/or Wo Contrast  Result Date: 01/21/2018 CLINICAL DATA:  Right-sided chest pain. EXAM: CT ANGIOGRAPHY CHEST WITH CONTRAST TECHNIQUE: Multidetector CT imaging of the chest was performed using the standard protocol during bolus administration of intravenous contrast. Multiplanar CT image reconstructions and MIPs were obtained to evaluate the vascular anatomy. CONTRAST:  64 cc ISOVUE-370 IOPAMIDOL (ISOVUE-370) INJECTION 76% COMPARISON:  Chest radiograph yesterday FINDINGS: Cardiovascular: There are no filling defects within the pulmonary arteries to suggest pulmonary embolus. The thoracic aorta is normal in caliber. Heart is normal in size. No pericardial effusion. Mediastinum/Nodes: No enlarged mediastinal or hilar lymph nodes. No axillary adenopathy. Small hiatal hernia. No thyroid nodule. Lungs/Pleura: No consolidation, pulmonary edema or pleural fluid. Trachea and mainstem bronchi are patent. 4 mm subpleural left upper lobe nodule image 41 series 10. Upper Abdomen: Simple cyst in the left kidney, partially included. 12 mm left adrenal nodule not definitively adrenal adenoma on the current exam. Musculoskeletal: There are no acute or suspicious osseous  abnormalities. Review of the MIP images confirms the above findings. IMPRESSION: 1. No pulmonary embolus.  No acute intrathoracic abnormality. 2. Tiny 4 mm subpleural left upper lobe pulmonary nodule, possibly an intrapulmonary lymph node. No follow-up needed if patient is low-risk. Non-contrast chest CT can be considered in 12 months if patient is high-risk. This recommendation follows the consensus statement: Guidelines for Management of Incidental Pulmonary Nodules Detected on CT Images: From the Fleischner Society 2017; Radiology 2017; 284:228-243. 3. Incidental 12 mm left adrenal nodule in the upper abdomen. In the absence of malignancy history this is probably benign, consider a follow-up adrenal CT in 12 months. If there is history of malignancy, recommend adrenal CT now. 4. Small hiatal hernia. Electronically Signed   By: Rubye OaksMelanie  Ehinger M.D.   On: 01/21/2018 02:01    Procedures Procedures (including critical care time)  Medications Ordered in ED Medications  ketorolac (TORADOL) 30 MG/ML injection 15 mg (15 mg Intravenous Given 01/21/18 0117)  iopamidol (ISOVUE-370) 76 % injection (100 mLs  Contrast Given 01/21/18 0128)    Ruled out for MI and PE in the ED.  Episodes last seconds and are clearly not cardiac.  Follow up with your PMD for ongoing care.  CT to assess pulmonary nodule as an outpatient in 6 months ask your doctor to schedule this.  It was printed on your discharge.    Final Clinical Impressions(s) / ED Diagnoses   Return for weakness, numbness, changes in vision or speech,  fevers > 100.4 unrelieved by medication, shortness of breath, intractable vomiting, or diarrhea, abdominal pain, Inability to tolerate liquids or food, cough, altered mental status or any concerns. No signs of systemic illness or infection. The patient is nontoxic-appearing on exam and vital signs are within normal limits.    I have reviewed the triage vital signs and the nursing notes. Pertinent labs  &imaging results that were available during my care of the patient were reviewed by me and considered in my medical decision making (see chart for details).  After history, exam, and medical workup I feel the patient has been appropriately medically screened and is safe for discharge home. Pertinent diagnoses were discussed with  the patient. Patient was given return precautions.   Robbert Langlinais, MD 01/21/18 1610

## 2018-01-22 NOTE — Telephone Encounter (Signed)
Patient is only prescribed Metformin. Onglyza was discontinued.

## 2018-01-24 NOTE — Telephone Encounter (Signed)
Left a vm for patient to callback 

## 2018-01-24 NOTE — Telephone Encounter (Signed)
Patient notified

## 2018-02-16 ENCOUNTER — Telehealth: Payer: Self-pay | Admitting: *Deleted

## 2018-02-16 NOTE — Telephone Encounter (Signed)
Kristen,  This pt is cleared for anesthetic care at [facility].  Thanks,  Arielis Leonhart 

## 2018-02-16 NOTE — Telephone Encounter (Signed)
Dr. Myrtie Neitheranis and John RuizJohn,  This pt is coming in for a colonoscopy on 03-22-18.  He was seen in the ED on 01-21-18 for chest pain.  According to the ED note, this is a chronic problem for the patient and that specific episode started about 1 week prior to his ED visit. The MD states episode are clearly not cardiac.  I just wanted to have you review his chart to see if he is ok for a direct colonoscopy or if you would like an office visit.  Thanks, WPS ResourcesKristen

## 2018-02-16 NOTE — Telephone Encounter (Signed)
noted 

## 2018-02-16 NOTE — Telephone Encounter (Signed)
John,  Thanks for your input.  Kristen,   Proceed as planned.  - H. Myrtie Neitheranis, MD

## 2018-02-21 ENCOUNTER — Ambulatory Visit: Payer: Self-pay | Admitting: Family Medicine

## 2018-03-22 ENCOUNTER — Encounter: Payer: Self-pay | Admitting: Gastroenterology

## 2018-04-06 ENCOUNTER — Encounter: Payer: Self-pay | Admitting: Family Medicine

## 2018-08-03 IMAGING — DX DG CHEST 2V
2 series · 2 of 2 positions shown · non-contrast
Comparison: 03/14/2012

CLINICAL DATA: Right-sided chest pain

EXAM:
CHEST  2 VIEW

[chest pa]
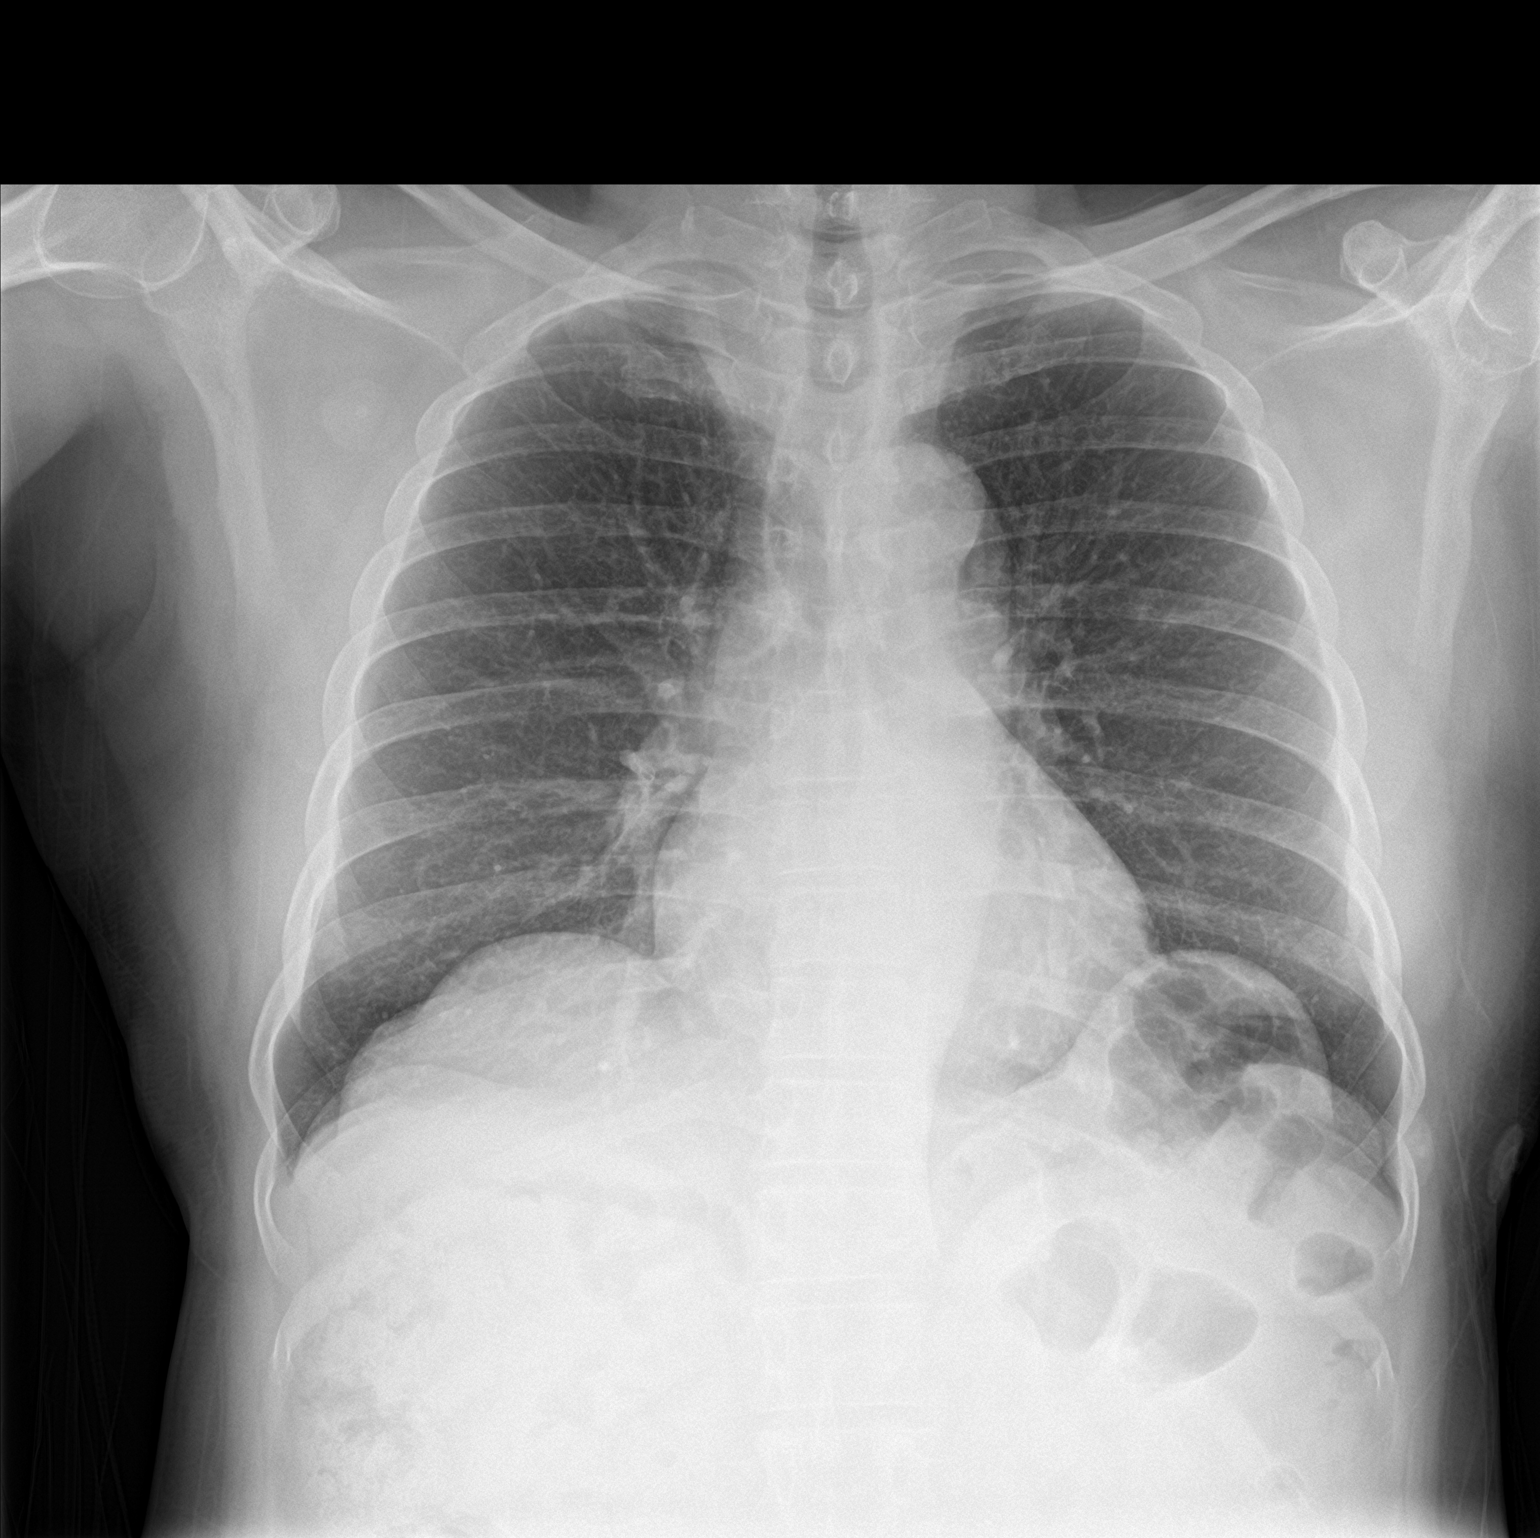

[chest lat]
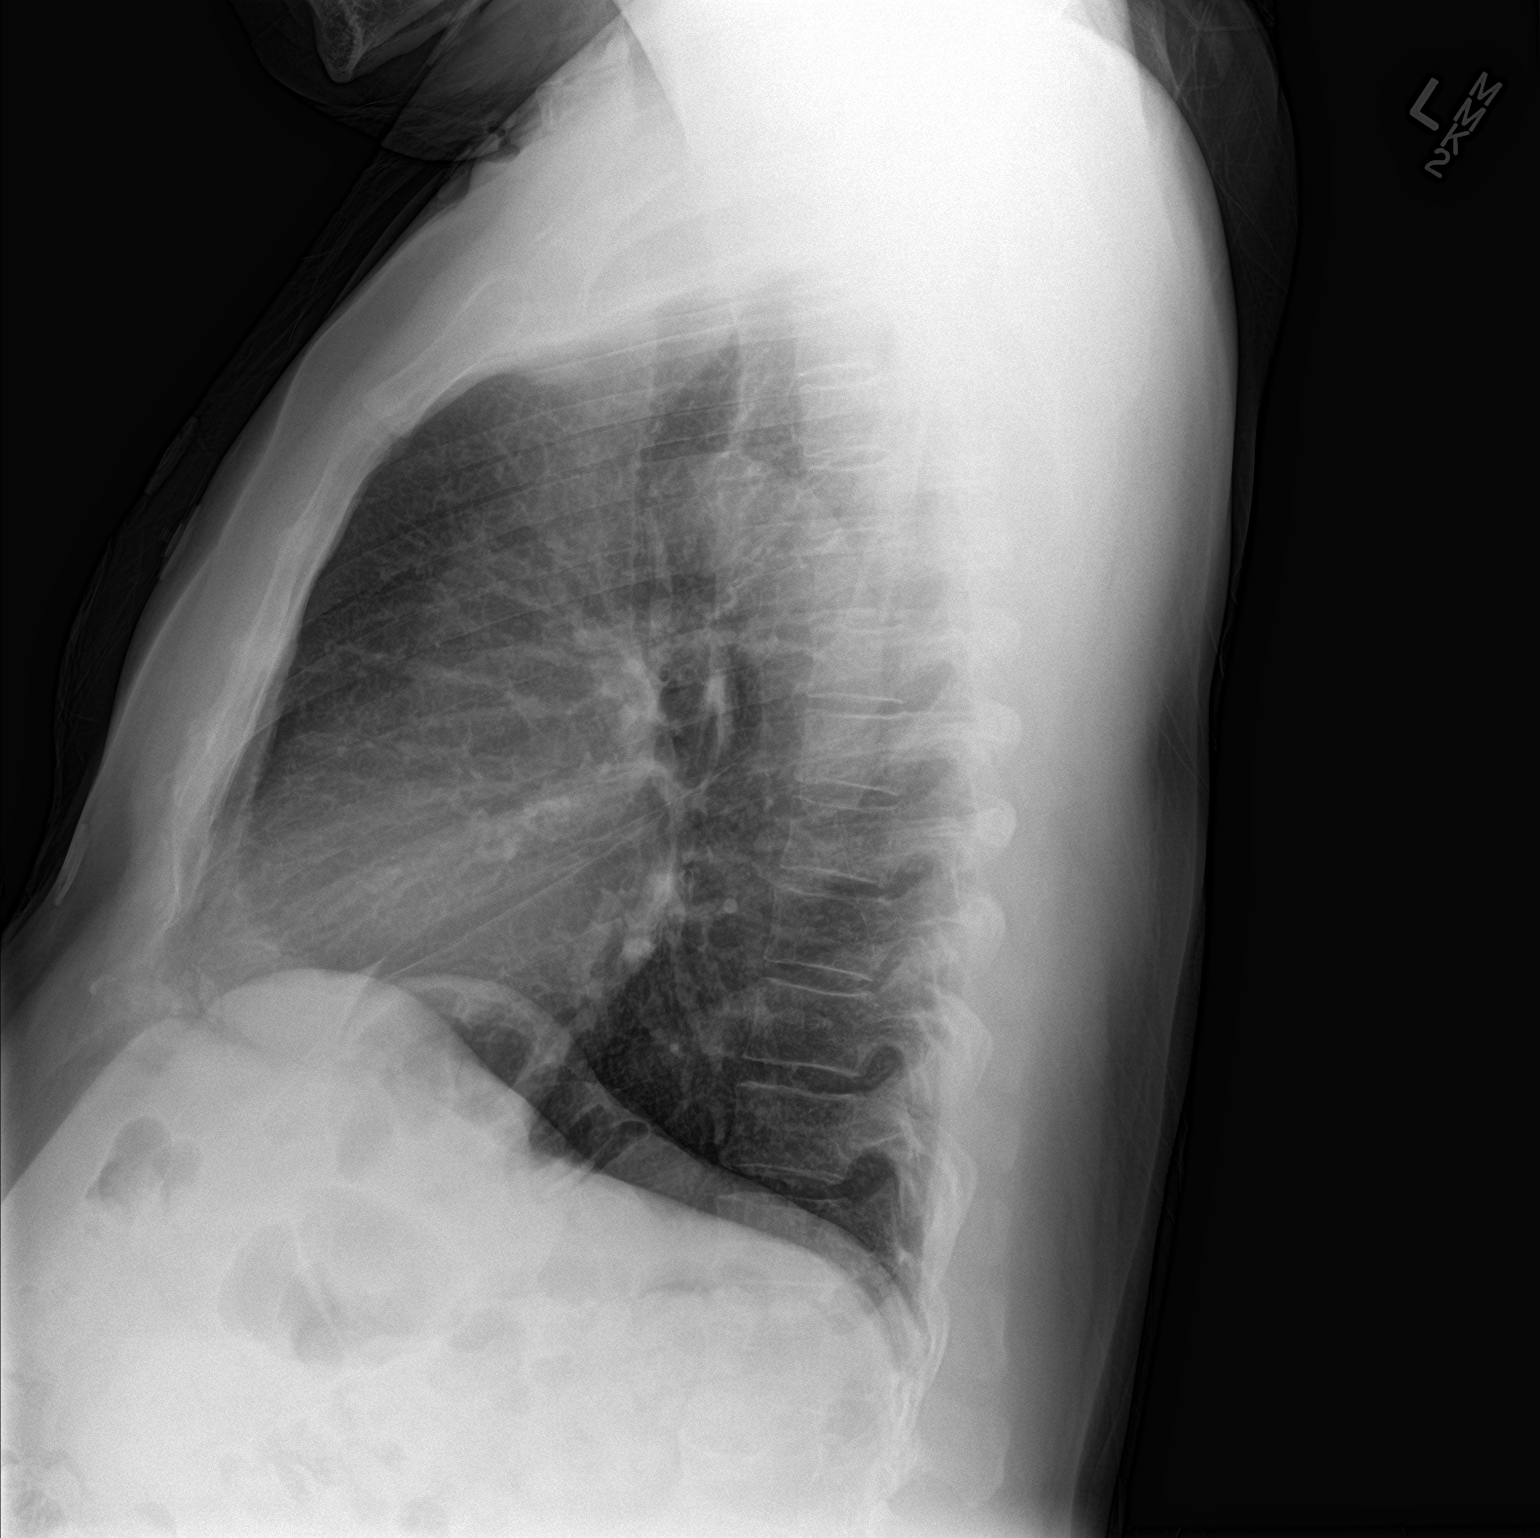

[2 of 2 positions shown; findings below may reference images not displayed]

FINDINGS: The heart size and mediastinal contours are within normal limits.
Both lungs are clear. The visualized skeletal structures are
unremarkable.
IMPRESSION: No active cardiopulmonary disease.

## 2018-10-18 ENCOUNTER — Ambulatory Visit: Payer: Self-pay | Admitting: Family Medicine

## 2018-10-19 ENCOUNTER — Other Ambulatory Visit: Payer: Self-pay

## 2018-10-19 ENCOUNTER — Encounter: Payer: Self-pay | Admitting: Nurse Practitioner

## 2018-10-19 ENCOUNTER — Ambulatory Visit: Payer: Self-pay | Attending: Nurse Practitioner | Admitting: Nurse Practitioner

## 2018-10-19 VITALS — BP 180/96 | HR 71 | Temp 98.7°F | Resp 16 | Wt 182.6 lb

## 2018-10-19 DIAGNOSIS — Z79890 Hormone replacement therapy (postmenopausal): Secondary | ICD-10-CM | POA: Insufficient documentation

## 2018-10-19 DIAGNOSIS — Z7984 Long term (current) use of oral hypoglycemic drugs: Secondary | ICD-10-CM | POA: Insufficient documentation

## 2018-10-19 DIAGNOSIS — E119 Type 2 diabetes mellitus without complications: Secondary | ICD-10-CM | POA: Insufficient documentation

## 2018-10-19 DIAGNOSIS — I10 Essential (primary) hypertension: Secondary | ICD-10-CM | POA: Insufficient documentation

## 2018-10-19 DIAGNOSIS — Z9889 Other specified postprocedural states: Secondary | ICD-10-CM | POA: Insufficient documentation

## 2018-10-19 DIAGNOSIS — Z79899 Other long term (current) drug therapy: Secondary | ICD-10-CM | POA: Insufficient documentation

## 2018-10-19 DIAGNOSIS — Z7982 Long term (current) use of aspirin: Secondary | ICD-10-CM | POA: Insufficient documentation

## 2018-10-19 DIAGNOSIS — E039 Hypothyroidism, unspecified: Secondary | ICD-10-CM | POA: Insufficient documentation

## 2018-10-19 DIAGNOSIS — E782 Mixed hyperlipidemia: Secondary | ICD-10-CM | POA: Insufficient documentation

## 2018-10-19 DIAGNOSIS — Z881 Allergy status to other antibiotic agents status: Secondary | ICD-10-CM | POA: Insufficient documentation

## 2018-10-19 DIAGNOSIS — Z833 Family history of diabetes mellitus: Secondary | ICD-10-CM | POA: Insufficient documentation

## 2018-10-19 DIAGNOSIS — D696 Thrombocytopenia, unspecified: Secondary | ICD-10-CM | POA: Insufficient documentation

## 2018-10-19 DIAGNOSIS — K219 Gastro-esophageal reflux disease without esophagitis: Secondary | ICD-10-CM | POA: Insufficient documentation

## 2018-10-19 DIAGNOSIS — R7989 Other specified abnormal findings of blood chemistry: Secondary | ICD-10-CM

## 2018-10-19 LAB — GLUCOSE, POCT (MANUAL RESULT ENTRY): POC Glucose: 121 mg/dl — AB (ref 70–99)

## 2018-10-19 LAB — POCT GLYCOSYLATED HEMOGLOBIN (HGB A1C): Hemoglobin A1C: 7.1 % — AB (ref 4.0–5.6)

## 2018-10-19 MED ORDER — OMEPRAZOLE 40 MG PO CPDR: 40.0000 mg | DELAYED_RELEASE_CAPSULE | Freq: Every day | ORAL | 1 refills | Status: DC

## 2018-10-19 MED ORDER — PRAVASTATIN SODIUM 40 MG PO TABS: 40.0000 mg | ORAL_TABLET | Freq: Every day | ORAL | 3 refills | Status: DC

## 2018-10-19 MED ORDER — LISINOPRIL 20 MG PO TABS: 20.0000 mg | ORAL_TABLET | Freq: Every day | ORAL | 3 refills | Status: DC

## 2018-10-19 MED ORDER — METFORMIN HCL 500 MG PO TABS: 500.0000 mg | ORAL_TABLET | Freq: Two times a day (BID) | ORAL | 2 refills | Status: DC

## 2018-10-19 MED ORDER — AMLODIPINE BESYLATE 10 MG PO TABS: 10.0000 mg | ORAL_TABLET | Freq: Every day | ORAL | 1 refills | Status: DC

## 2018-10-19 NOTE — Progress Notes (Signed)
Assessment & Plan:  John Hayden was seen today for establish care, diabetes and hypertension.  Diagnoses and all orders for this visit:  Controlled type 2 diabetes mellitus without complication, without long-term current use of insulin (HCC) -     Cancel: Hemoglobin A1c -     CMP14+EGFR -     Lipid panel -     Glucose (CBG) -     HgB A1c -     metFORMIN (GLUCOPHAGE) 500 MG tablet; Take 1 tablet (500 mg total) by mouth 2 (two) times daily with a meal. Continue blood sugar control as discussed in office today, low carbohydrate diet, and regular physical exercise as tolerated, 150 minutes per week (30 min each day, 5 days per week, or 50 min 3 days per week). Keep blood sugar logs with fasting goal of 90-130 mg/dl, post prandial (after you eat) less than 180.  For Hypoglycemia: BS <60 and Hyperglycemia BS >400; contact the clinic ASAP. Annual eye exams and foot exams are recommended.   Hypothyroidism -     TSH  Essential hypertension -     amLODipine (NORVASC) 10 MG tablet; Take 1 tablet (10 mg total) by mouth daily. -     lisinopril (PRINIVIL,ZESTRIL) 20 MG tablet; Take 1 tablet (20 mg total) by mouth daily. Continue all antihypertensives as prescribed.  Remember to bring in your blood pressure log with you for your follow up appointment.  DASH/Mediterranean Diets are healthier choices for HTN.   Mixed hyperlipidemia -     pravastatin (PRAVACHOL) 40 MG tablet; Take 1 tablet (40 mg total) by mouth daily. Please fill as a 90 day supply INSTRUCTIONS: Work on a low fat, heart healthy diet and participate in regular aerobic exercise program by working out at least 150 minutes per week; 5 days a week-30 minutes per day. Avoid red meat, fried foods. junk foods, sodas, sugary drinks, unhealthy snacking, alcohol and smoking.  Drink at least 48oz of water per day and monitor your carbohydrate intake daily.    Gastroesophageal reflux disease, esophagitis presence not specified -     omeprazole  (PRILOSEC) 40 MG capsule; Take 1 capsule (40 mg total) by mouth daily. Reported on 02/24/2016 INSTRUCTIONS: Avoid GERD Triggers: acidic, spicy or fried foods, caffeine, coffee, sodas,  alcohol and chocolate.    Thrombocytopenia ([place]) -     CBC  Patient has been counseled on age-appropriate routine health concerns for screening and prevention. These are reviewed and up-to-date. Referrals have been placed accordingly. Immunizations are up-to-date or declined.    Subjective:   Chief Complaint  Patient presents with  . Establish Care  . Diabetes  . Hypertension   HPI John Hayden 67 y.o. male presents to office today to establish care. He has a history of GERD, Thrombocytopenia, Hypothyroidism, HTN, DM and HPL. He has an onsite interpreter with him today.   DM TYPE 2 A1c is stable. He denies any hypo or hyperglycemic symptoms. He monitors his blood glucose levels at home once a day and usually fasting. I have instructed him to also check post prandial readings as well.  He endorses medication compliance taking metformin 500 mg BID. Denies any GI upset. He is overdue for eye exam. Patient has been advised to apply for financial assistance and schedule to see our financial counselor.  Lab Results  Component Value Date   HGBA1C 7.1 (A) 10/19/2018    Hyperlipidemia Patient presents for follow up to hyperlipidemia.  He is medication compliant taking pravastatin 70m  daily. He is diet compliant and denies exertional chest pressure/discomfort, lower extremity edema and skin xanthelasma or statin intolerance including myalgias. LDL not at goal.  Lab Results  Component Value Date   CHOL 164 04/12/2017   Lab Results  Component Value Date   HDL 45 04/12/2017   Lab Results  Component Value Date   LDLCALC 98 04/12/2017   Lab Results  Component Value Date   TRIG 105 04/12/2017   Lab Results  Component Value Date   CHOLHDL 3.6 04/12/2017   CHRONIC HYPERTENSION Disease  Monitoring  Blood pressure is not well controlled today. However he states he did not take any of his antihypertensives today. I have instructed him to take his antihypertensives in the morning to decrease the risk of noncompliance.  BP Readings from Last 3 Encounters:  10/19/18 (!) 180/96  01/21/18 136/86  01/07/18 140/68   Chest pain: no   Dyspnea: no   Claudication: no  Medication compliance: no, he is not taking his blood pressure medication every morning. States he sometimes forgets.   Medication Side Effects  Lightheadedness: no   Urinary frequency: no   Edema: no   Impotence: no  Preventitive Healthcare:  Exercise: no   Diet Pattern: diet: general  Salt Restriction:  no   GERD  Symptoms well controlled with omeprazole 40m daily.   Review of Systems  Constitutional: Negative for fever, malaise/fatigue and weight loss.  HENT: Negative.  Negative for nosebleeds.   Eyes: Negative.  Negative for blurred vision, double vision and photophobia.  Respiratory: Negative.  Negative for cough and shortness of breath.   Cardiovascular: Negative.  Negative for chest pain, palpitations and leg swelling.  Gastrointestinal: Positive for heartburn. Negative for nausea and vomiting.  Musculoskeletal: Negative.  Negative for myalgias.  Neurological: Negative.  Negative for dizziness, focal weakness, seizures and headaches.  Psychiatric/Behavioral: Negative.  Negative for suicidal ideas.    Past Medical History:  Diagnosis Date  . Chest pain    echo 4/13: EF 50-55%, mild AI;  Myoview 4/13: EF 59%, no ischemia  . Diabetes mellitus    borderline diabetic controlled by diet   . GERD (gastroesophageal reflux disease)   . Hypertension   . Mild aortic insufficiency   . [place]     Past Surgical History:  Procedure Laterality Date  . hernia     inguinal and abdominal  . HERNIA REPAIR    . LEFT HEART CATHETERIZATION WITH CORONARY ANGIOGRAM [name] 03/14/2012   Procedure: LEFT  HEART CATHETERIZATION WITH CORONARY ANGIOGRAM;  Surgeon: Peter M JMartinique MD;  [facility];  Service: Cardiovascular;  Laterality: [name];    Family History  Problem Relation Age of Onset  . Diabetes Mother     Social History Reviewed with no changes to be made today.   Outpatient Medications Prior to Visit  Medication Sig Dispense Refill  . aspirin EC 81 MG tablet Take 1 tablet (81 mg total) by mouth daily. Reported on 02/24/2016 90 tablet 1  . levothyroxine (SYNTHROID, LEVOTHROID) 25 MCG tablet Take 1 tablet (25 mcg total) by mouth daily before breakfast. 90 tablet 0  . amLODipine (NORVASC) 10 MG tablet Take 1 tablet (10 mg total) by mouth daily. 90 tablet 1  . lisinopril (PRINIVIL,ZESTRIL) 20 MG tablet Take 1 tablet (20 mg total) by mouth daily. 90 tablet 3  . metFORMIN (GLUCOPHAGE) 500 MG tablet Take 1 tablet (500 mg total) by mouth 2 (two) times daily with a meal. 180 tablet 2  .  omeprazole (PRILOSEC) 40 MG capsule Take 1 capsule (40 mg total) by mouth daily. Reported on 02/24/2016 90 capsule 1  . pravastatin (PRAVACHOL) 40 MG tablet Take 1 tablet (40 mg total) by mouth daily. 90 tablet 3  . PRESCRIPTION MEDICATION Take 4 mg by mouth daily. 1 tablet once daily. ( cardox 4 mg. ( for prostate)    . PRESCRIPTION MEDICATION Take 1 tablet by mouth daily. Reported on 03/13/2016    . cyclobenzaprine (FLEXERIL) 10 MG tablet Take 1 tablet (10 mg total) by mouth 3 (three) times daily as needed for muscle spasms. (Patient not taking: Reported on 10/19/2018) 30 tablet 0  . meloxicam (MOBIC) 15 MG tablet Take 0.5 tablets (7.5 mg total) by mouth daily as needed for pain. (Patient not taking: Reported on 01/07/2018) 30 tablet 1   No facility-administered medications prior to visit.     Allergies  Allergen Reactions  . Doxycycline Itching       Objective:    BP (!) 180/96 (BP Location: Right Arm, Patient Position: Sitting, Cuff Size: Large)   Pulse 71   Temp 98.7 F (37.1 C) (Oral)   Resp  16   Wt 182 lb 9.6 oz (82.8 kg)   SpO2 99%   BMI 31.34 kg/m  Wt Readings from Last 3 Encounters:  10/19/18 182 lb 9.6 oz (82.8 kg)  01/07/18 179 lb (81.2 kg)  06/28/17 179 lb (81.2 kg)    Physical Exam  Constitutional: He is oriented to person, place, and time. He appears well-developed and well-nourished. He is cooperative.  HENT:  Head: Normocephalic and atraumatic.  Eyes: EOM are normal.  Neck: Normal range of motion.  Cardiovascular: Normal rate, regular rhythm and normal heart sounds. Exam reveals no gallop and no friction rub.  No murmur heard. Pulmonary/Chest: Effort normal and breath sounds normal. No tachypnea. No respiratory distress. He has no decreased breath sounds. He has no wheezes. He has no rhonchi. He has no rales. He exhibits no tenderness.  Abdominal: Soft. Bowel sounds are normal.  Musculoskeletal: Normal range of motion. He exhibits no edema.  Neurological: He is alert and oriented to person, place, and time. Coordination normal.  Skin: Skin is warm and dry.  Psychiatric: He has a normal mood and affect. His behavior is normal. Judgment and thought content normal.  Nursing note and vitals reviewed.      Patient has been counseled extensively about nutrition and exercise as well as the importance of adherence with medications and regular follow-up. The patient was given clear instructions to go to ER or return to medical center if symptoms don't improve, worsen or new problems develop. The patient verbalized understanding.   Follow-up: Return in about 3 months (around 01/19/2019).   Gildardo Pounds, FNP-BC [facility] and [place], [place]   10/19/2018, 8:51 PM

## 2018-10-19 NOTE — Progress Notes (Signed)
Establish care : DM and HTN

## 2018-10-19 NOTE — Patient Instructions (Signed)

## 2018-10-20 LAB — LIPID PANEL
CHOLESTEROL TOTAL: 188 mg/dL (ref 100–199)
Chol/HDL Ratio: 4.7 ratio (ref 0.0–5.0)
HDL: 40 mg/dL (ref >39)
LDL CALC: 115 mg/dL — AB (ref 0–99)
TRIGLYCERIDES: 165 mg/dL — AB (ref 0–149)
VLDL CHOLESTEROL CAL: 33 mg/dL (ref 5–40)

## 2018-10-20 LAB — CMP14+EGFR
ALBUMIN: 4.3 g/dL (ref 3.6–4.8)
ALK PHOS: 49 IU/L (ref 39–117)
ALT: 18 IU/L (ref 0–44)
AST: 16 IU/L (ref 0–40)
Albumin/Globulin Ratio: 2 (ref 1.2–2.2)
BUN / CREAT RATIO: 13 (ref 10–24)
BUN: 18 mg/dL (ref 8–27)
Bilirubin Total: <0.2 mg/dL (ref 0.0–1.2)
CO2: 25 mmol/L (ref 20–29)
CREATININE: 1.42 mg/dL — AB (ref 0.76–1.27)
Calcium: 8.9 mg/dL (ref 8.6–10.2)
Chloride: 100 mmol/L (ref 96–106)
GFR calc Af Amer: 58 mL/min/{1.73_m2} — ABNORMAL LOW (ref >59)
GFR calc non Af Amer: 50 mL/min/{1.73_m2} — ABNORMAL LOW (ref >59)
GLUCOSE: 139 mg/dL — AB (ref 65–99)
Globulin, Total: 2.2 g/dL (ref 1.5–4.5)
Potassium: 3.7 mmol/L (ref 3.5–5.2)
Sodium: 138 mmol/L (ref 134–144)
Total Protein: 6.5 g/dL (ref 6.0–8.5)

## 2018-10-20 LAB — CBC
Hematocrit: 39.9 % (ref 37.5–51.0)
Hemoglobin: 13.2 g/dL (ref 13.0–17.7)
MCH: 26.3 pg — AB (ref 26.6–33.0)
MCHC: 33.1 g/dL (ref 31.5–35.7)
MCV: 80 fL (ref 79–97)
PLATELETS: 127 10*3/uL — AB (ref 150–450)
RBC: 5.01 x10E6/uL (ref 4.14–5.80)
RDW: 13.1 % (ref 12.3–15.4)
WBC: 5.5 10*3/uL (ref 3.4–10.8)

## 2018-10-20 LAB — TSH: TSH: 4.9 u[IU]/mL — ABNORMAL HIGH (ref 0.450–4.500)

## 2018-10-24 ENCOUNTER — Other Ambulatory Visit: Payer: Self-pay | Admitting: Nurse Practitioner

## 2018-10-24 ENCOUNTER — Telehealth: Payer: Self-pay | Admitting: Family Medicine

## 2018-10-24 DIAGNOSIS — D696 Thrombocytopenia, unspecified: Secondary | ICD-10-CM

## 2018-10-24 NOTE — Telephone Encounter (Signed)
CMA spoke to patient's daughter and they wanted to know his thyroid results.   Will route to PCP.

## 2018-10-24 NOTE — Telephone Encounter (Signed)
Pt's daughter called to request advice on patients medicine.  please follow up as soon as possible

## 2018-10-28 ENCOUNTER — Telehealth: Payer: Self-pay | Admitting: Hematology

## 2018-10-28 ENCOUNTER — Ambulatory Visit: Payer: Self-pay | Attending: Nurse Practitioner

## 2018-10-28 ENCOUNTER — Encounter: Payer: Self-pay | Admitting: Hematology

## 2018-10-28 NOTE — Telephone Encounter (Signed)
CMA attempt to reach patient to inform on results.  No answer and and left a VM for patient.  If patient call back, please inform:  Thyroid level is only slightly elevated. Will continue to monitor at this time. Due to age I do not feel taking thyroid medication is warranted at this time. His Platelets continue low. Will refer to blood specialist. Cholesterol levels are elevated. Continue your cholesterol medication as prescribed.

## 2018-10-28 NOTE — Telephone Encounter (Signed)
-----   Message from Claiborne RiggZelda W Fleming, NP sent at 10/24/2018  9:43 PM EST ----- Thyroid level is only slightly elevated. Will continue to monitor at this time. Due to age I do not feel taking thyroid medication is warranted at this time. His Platelets continue low. Will refer to blood specialist. Cholesterol levels are elevated. Continue your cholesterol medication as prescribed.

## 2018-10-28 NOTE — Telephone Encounter (Signed)
New hem appt has been scheduled for the pt to see Dr. Candise CheKale on 10/17 at 10am. Letter mailed to the pt with the appt date and time.

## 2018-10-31 NOTE — Telephone Encounter (Signed)
A letter will be send out to reach patient  

## 2018-11-01 ENCOUNTER — Ambulatory Visit: Payer: Self-pay

## 2018-11-22 ENCOUNTER — Encounter: Payer: Self-pay | Admitting: Hematology

## 2018-11-28 ENCOUNTER — Encounter: Payer: Self-pay | Admitting: Hematology

## 2019-01-20 ENCOUNTER — Ambulatory Visit: Payer: Self-pay | Admitting: Nurse Practitioner

## 2021-09-17 ENCOUNTER — Other Ambulatory Visit: Payer: Self-pay

## 2021-09-17 ENCOUNTER — Encounter: Payer: Self-pay | Admitting: Nurse Practitioner

## 2021-09-17 ENCOUNTER — Ambulatory Visit: Payer: Self-pay | Attending: Nurse Practitioner | Admitting: Nurse Practitioner

## 2021-09-17 VITALS — BP 188/97 | HR 78 | Ht 64.0 in | Wt 180.0 lb

## 2021-09-17 DIAGNOSIS — R7989 Other specified abnormal findings of blood chemistry: Secondary | ICD-10-CM

## 2021-09-17 DIAGNOSIS — E782 Mixed hyperlipidemia: Secondary | ICD-10-CM

## 2021-09-17 DIAGNOSIS — E119 Type 2 diabetes mellitus without complications: Secondary | ICD-10-CM

## 2021-09-17 DIAGNOSIS — I10 Essential (primary) hypertension: Secondary | ICD-10-CM

## 2021-09-17 DIAGNOSIS — R350 Frequency of micturition: Secondary | ICD-10-CM

## 2021-09-17 DIAGNOSIS — K219 Gastro-esophageal reflux disease without esophagitis: Secondary | ICD-10-CM

## 2021-09-17 DIAGNOSIS — D696 Thrombocytopenia, unspecified: Secondary | ICD-10-CM

## 2021-09-17 DIAGNOSIS — N401 Enlarged prostate with lower urinary tract symptoms: Secondary | ICD-10-CM

## 2021-09-17 LAB — GLUCOSE, POCT (MANUAL RESULT ENTRY): POC Glucose: 287 mg/dl — AB (ref 70–99)

## 2021-09-17 LAB — POCT GLYCOSYLATED HEMOGLOBIN (HGB A1C): Hemoglobin A1C: 7.6 % — AB (ref 4.0–5.6)

## 2021-09-17 MED ORDER — LEVOTHYROXINE SODIUM 25 MCG PO TABS
25.0000 ug | ORAL_TABLET | Freq: Every day | ORAL | 0 refills | Status: DC
  Filled 2021-09-17: qty 30, 30d supply, fill #0
  Filled 2021-12-17: qty 60, 60d supply, fill #0

## 2021-09-17 MED ORDER — AMLODIPINE BESYLATE 10 MG PO TABS
10.0000 mg | ORAL_TABLET | Freq: Every day | ORAL | 1 refills | Status: DC
  Filled 2021-09-17 – 2021-12-17 (×2): qty 90, 90d supply, fill #0

## 2021-09-17 MED ORDER — PRAVASTATIN SODIUM 40 MG PO TABS
40.0000 mg | ORAL_TABLET | Freq: Every day | ORAL | 3 refills | Status: DC
  Filled 2021-09-17 – 2021-12-17 (×2): qty 90, 90d supply, fill #0
  Filled 2022-08-27: qty 30, 30d supply, fill #1
  Filled 2022-09-17: qty 30, 30d supply, fill #2

## 2021-09-17 MED ORDER — METFORMIN HCL 500 MG PO TABS
500.0000 mg | ORAL_TABLET | Freq: Two times a day (BID) | ORAL | 2 refills | Status: DC
  Filled 2021-09-17 – 2021-12-17 (×2): qty 180, 90d supply, fill #0
  Filled 2022-08-27: qty 60, 30d supply, fill #1
  Filled 2022-09-17: qty 60, 30d supply, fill #2

## 2021-09-17 MED ORDER — OMEPRAZOLE 40 MG PO CPDR
40.0000 mg | DELAYED_RELEASE_CAPSULE | Freq: Every day | ORAL | 1 refills | Status: DC
  Filled 2021-09-17 – 2021-12-17 (×2): qty 90, 90d supply, fill #0

## 2021-09-17 MED ORDER — LISINOPRIL 20 MG PO TABS
20.0000 mg | ORAL_TABLET | Freq: Every day | ORAL | 3 refills | Status: DC
  Filled 2021-09-17 – 2021-12-17 (×2): qty 90, 90d supply, fill #0
  Filled 2022-08-27: qty 30, 30d supply, fill #1
  Filled 2022-09-17: qty 30, 30d supply, fill #2

## 2021-09-17 NOTE — Progress Notes (Signed)
Assessment & Plan:  Claire was seen today for diabetes.  Diagnoses and all orders for this visit:  Type 2 diabetes mellitus without complication, without long-term current use of insulin (HCC) -     POCT glucose (manual entry) -     POCT glycosylated hemoglobin (Hb A1C) -     CMP14+EGFR -     Microalbumin / creatinine urine ratio -     metFORMIN (GLUCOPHAGE) 500 MG tablet; Take 1 tablet (500 mg total) by mouth 2 (two) times daily with a meal. Continue blood sugar control as discussed in office today, low carbohydrate diet, and regular physical exercise as tolerated, 150 minutes per week (30 min each day, 5 days per week, or 50 min 3 days per week). Keep blood sugar logs with fasting goal of 90-130 mg/dl, post prandial (after you eat) less than 180.  For Hypoglycemia: BS <60 and Hyperglycemia BS >400; contact the clinic ASAP. Annual eye exams and foot exams are recommended.   Primary hypertension -     CMP14+EGFR -     amLODipine (NORVASC) 10 MG tablet; Take 1 tablet (10 mg total) by mouth daily. -     lisinopril (ZESTRIL) 20 MG tablet; Take 1 tablet (20 mg total) by mouth daily. Continue all antihypertensives as prescribed.  Remember to bring in your blood pressure log with you for your follow up appointment.  DASH/Mediterranean Diets are healthier choices for HTN.    Thrombocytopenia (HCC) -     CBC  Elevated TSH -     Thyroid Panel With TSH -     levothyroxine (SYNTHROID) 25 MCG tablet; Take 1 tablet (25 mcg total) by mouth daily before breakfast.  Mixed hyperlipidemia -     pravastatin (PRAVACHOL) 40 MG tablet; Take 1 tablet (40 mg total) by mouth daily. Please fill as a 90 day supply INSTRUCTIONS: Work on a low fat, heart healthy diet and participate in regular aerobic exercise program by working out at least 150 minutes per week; 5 days a week-30 minutes per day. Avoid red meat/beef/steak,  fried foods. junk foods, sodas, sugary drinks, unhealthy snacking, alcohol and  smoking.  Drink at least 80 oz of water per day and monitor your carbohydrate intake daily.     Benign prostatic hyperplasia with urinary frequency -     PSA  GERD without esophagitis -     omeprazole (PRILOSEC) 40 MG capsule; Take 1 capsule (40 mg total) by mouth daily. Please fill as a 90 day supply INSTRUCTIONS: Avoid GERD Triggers: acidic, spicy or fried foods, caffeine, coffee, sodas,  alcohol and chocolate.    Patient has been counseled on age-appropriate routine health concerns for screening and prevention. These are reviewed and up-to-date. Referrals have been placed accordingly. Immunizations are up-to-date or declined.    Subjective:   Chief Complaint  Patient presents with   Diabetes   HPI John Hayden 70 y.o. male presents to office today to re establish care.   has a past medical history of Chest pain, Diabetes mellitus, GERD (gastroesophageal reflux disease), Hypertension, Mild aortic insufficiency, and Thrombocytopenia ([place]).    Accompanied by his daughter today who is translating for him today.   DM 2 Out of metformin for 2 days. He has been getting his medications from [place]. Reports home fasting average readings:  130-140s. He is prescribed metformin 500 mg BID. Denies hypo or hyperglycemic symptoms. Blood glucose is elevated today. He does endorse having a croissant this morning. LDL not at goal with  pravastatin 40 mg daily. Lab Results  Component Value Date   HGBA1C 7.6 (A) 09/17/2021    Lab Results  Component Value Date   HGBA1C 7.1 (A) 10/19/2018    Lab Results  Component Value Date   LDLCALC 115 (H) 10/19/2018    HTN Blood pressure is elevated. He has been out of his amlodipine $RemoveBefore'10mg'[id number]$  daily and lisinopril $RemoveBefore'20mg'[id number]$  daily for quite some time now. He notes precordial chest pain intermittently. Also has history of hiatal hernia on imaging 3 years ago. Will likely need Cards and GI eval. Patient has been advised to apply for financial assistance and schedule to  see our financial counselor.  Denies shortness of breath, palpitations, lightheadedness, dizziness, headaches or BLE edema.   BP Readings from Last 3 Encounters:  09/17/21 (!) 188/97  10/19/18 (!) 180/96  01/21/18 136/86     Hypothyroidism Thyroid level slightly elevated. He is not currently taking levothyroxine. Will repeat levels today to determine if he needs to begin taking.  Lab Results  Component Value Date   TSH 4.900 (H) 10/19/2018    GERD Taking omeprazole 40 mg daily as prescribed.   GU Endorses urinary frequency that has been increasing. He has taken a one time dose of cardox for this in [place] and this helped relieve his symptoms. May possible need flomax. Will check PSA.   Review of Systems  Constitutional:  Negative for fever, malaise/fatigue and weight loss.  HENT: Negative.  Negative for nosebleeds.   Eyes: Negative.  Negative for blurred vision, double vision and photophobia.  Respiratory: Negative.  Negative for cough and shortness of breath.   Cardiovascular: Negative.  Negative for chest pain, palpitations and leg swelling.  Gastrointestinal:  Positive for heartburn. Negative for nausea and vomiting.  Genitourinary:  Positive for frequency and urgency. Negative for dysuria, flank pain and hematuria.  Musculoskeletal: Negative.  Negative for myalgias.  Neurological: Negative.  Negative for dizziness, focal weakness, seizures and headaches.  Psychiatric/Behavioral: Negative.  Negative for suicidal ideas.    Past Medical History:  Diagnosis Date   Chest pain    echo 4/13: EF 50-55%, mild AI;  Myoview 4/13: EF 59%, no ischemia   Diabetes mellitus    borderline diabetic controlled by diet    GERD (gastroesophageal reflux disease)    Hypertension    Mild aortic insufficiency    Thrombocytopenia ([place])     Past Surgical History:  Procedure Laterality Date   hernia     inguinal and abdominal   HERNIA REPAIR     LEFT HEART CATHETERIZATION WITH CORONARY  ANGIOGRAM [name] 03/14/2012   Procedure: LEFT HEART CATHETERIZATION WITH CORONARY ANGIOGRAM;  Surgeon: Peter M Martinique, MD;  [facility];  Service: Cardiovascular;  Laterality: [name];    Family History  Problem Relation Age of Onset   Diabetes Mother     Social History Reviewed with no changes to be made today.   Outpatient Medications Prior to Visit  Medication Sig Dispense Refill   PRESCRIPTION MEDICATION Take 4 mg by mouth daily. 1 tablet once daily. ( cardox 4 mg. ( for prostate)     PRESCRIPTION MEDICATION Take 1 tablet by mouth daily. Reported on 03/13/2016     amLODipine (NORVASC) 10 MG tablet Take 1 tablet (10 mg total) by mouth daily. 90 tablet 1   aspirin EC 81 MG tablet Take 1 tablet (81 mg total) by mouth daily. Reported on 02/24/2016 90 tablet 1   levothyroxine (SYNTHROID, LEVOTHROID) 25 MCG tablet Take 1  tablet (25 mcg total) by mouth daily before breakfast. 90 tablet 0   metFORMIN (GLUCOPHAGE) 500 MG tablet Take 1 tablet (500 mg total) by mouth 2 (two) times daily with a meal. 180 tablet 2   omeprazole (PRILOSEC) 40 MG capsule Take 1 capsule (40 mg total) by mouth daily. Reported on 02/24/2016 90 capsule 1   pravastatin (PRAVACHOL) 40 MG tablet Take 1 tablet (40 mg total) by mouth daily. Please fill as a 90 day supply 90 tablet 3   lisinopril (PRINIVIL,ZESTRIL) 20 MG tablet Take 1 tablet (20 mg total) by mouth daily. 90 tablet 3   No facility-administered medications prior to visit.    No Active Allergies      Objective:    BP (!) 188/97   Pulse 78   Ht _0  (1.626 m)   Wt 180 lb (81.6 kg)   SpO2 100%   BMI 30.90 kg/m  Wt Readings from Last 3 Encounters:  09/17/21 180 lb (81.6 kg)  10/19/18 182 lb 9.6 oz (82.8 kg)  01/07/18 179 lb (81.2 kg)    Physical Exam Vitals and nursing note reviewed.  Constitutional:      Appearance: He is well-developed.  HENT:     Head: Normocephalic and atraumatic.  Cardiovascular:     Rate and Rhythm: Normal rate and  regular rhythm.     Heart sounds: Normal heart sounds. No murmur heard.   No friction rub. No gallop.  Pulmonary:     Effort: Pulmonary effort is normal. No tachypnea or respiratory distress.     Breath sounds: Normal breath sounds. No decreased breath sounds, wheezing, rhonchi or rales.  Chest:     Chest wall: No tenderness.  Abdominal:     General: Bowel sounds are normal.     Palpations: Abdomen is soft.  Musculoskeletal:        General: Normal range of motion.     Cervical back: Normal range of motion.  Skin:    General: Skin is warm and dry.  Neurological:     Mental Status: He is alert and oriented to person, place, and time.     Coordination: Coordination normal.  Psychiatric:        Behavior: Behavior normal. Behavior is cooperative.        Thought Content: Thought content normal.        Judgment: Judgment normal.         Patient has been counseled extensively about nutrition and exercise as well as the importance of adherence with medications and regular follow-up. The patient was given clear instructions to go to ER or return to medical center if symptoms don't improve, worsen or new problems develop. The patient verbalized understanding.   Follow-up: Return in about 3 months (around 12/18/2021) for 4 weeks BP check with [facility]. See me in 3 months Shingles vaccine, DM, HTN.   Gildardo Pounds, FNP-BC [facility] and [facility] [place], [place]   09/17/2021, 9:55 AM

## 2021-09-18 LAB — CMP14+EGFR
ALT: 18 IU/L (ref 0–44)
AST: 17 IU/L (ref 0–40)
Albumin/Globulin Ratio: 1.8 (ref 1.2–2.2)
Albumin: 4.6 g/dL (ref 3.7–4.7)
Alkaline Phosphatase: 60 IU/L (ref 44–121)
BUN/Creatinine Ratio: 20 (ref 10–24)
BUN: 22 mg/dL (ref 8–27)
Bilirubin Total: 0.3 mg/dL (ref 0.0–1.2)
CO2: 21 mmol/L (ref 20–29)
Calcium: 9.2 mg/dL (ref 8.6–10.2)
Chloride: 99 mmol/L (ref 96–106)
Creatinine, Ser: 1.11 mg/dL (ref 0.76–1.27)
Globulin, Total: 2.5 g/dL (ref 1.5–4.5)
Glucose: 288 mg/dL — ABNORMAL HIGH (ref 70–99)
Potassium: 3.8 mmol/L (ref 3.5–5.2)
Sodium: 138 mmol/L (ref 134–144)
Total Protein: 7.1 g/dL (ref 6.0–8.5)
eGFR: 71 mL/min/{1.73_m2} (ref >59)

## 2021-09-18 LAB — CBC
Hematocrit: 41.9 % (ref 37.5–51.0)
Hemoglobin: 13.7 g/dL (ref 13.0–17.7)
MCH: 27.1 pg (ref 26.6–33.0)
MCHC: 32.7 g/dL (ref 31.5–35.7)
MCV: 83 fL (ref 79–97)
Platelets: 158 10*3/uL (ref 150–450)
RBC: 5.05 x10E6/uL (ref 4.14–5.80)
RDW: 13.1 % (ref 11.6–15.4)
WBC: 5.3 10*3/uL (ref 3.4–10.8)

## 2021-09-18 LAB — THYROID PANEL WITH TSH
Free Thyroxine Index: 1.2 (ref 1.2–4.9)
T3 Uptake Ratio: 27 % (ref 24–39)
T4, Total: 4.6 ug/dL (ref 4.5–12.0)
TSH: 5.11 u[IU]/mL — ABNORMAL HIGH (ref 0.450–4.500)

## 2021-09-18 LAB — PSA: Prostate Specific Ag, Serum: 1.1 ng/mL (ref 0.0–4.0)

## 2021-09-19 ENCOUNTER — Telehealth: Payer: Self-pay

## 2021-09-19 LAB — MICROALBUMIN / CREATININE URINE RATIO
Creatinine, Urine: 73.9 mg/dL
Microalb/Creat Ratio: 13 mg/g creat (ref 0–29)
Microalbumin, Urine: 9.8 ug/mL

## 2021-09-19 NOTE — Telephone Encounter (Signed)
-----   Message from Claiborne Rigg, NP sent at 09/19/2021  8:07 AM EDT ----- Glucose is elevated.  Kidney function and liver function are normal.  CBC does not indicate any anemia or bleeding disorders.  Thyroid level is only slightly elevated at this time.  I would like for you to continue to hold your thyroid medication (levothyroxine/Synthroid) meaning do not take it and we will continue to check your thyroid levels at each office visit.  Prostate level is normal and does not indicate any prostate cancer at this time.

## 2021-09-22 ENCOUNTER — Telehealth: Payer: Self-pay | Admitting: Nurse Practitioner

## 2021-09-22 NOTE — Telephone Encounter (Signed)
Disregard

## 2021-09-29 ENCOUNTER — Ambulatory Visit: Payer: Self-pay | Admitting: *Deleted

## 2021-09-29 NOTE — Telephone Encounter (Signed)
Reason for Disposition  SEVERE chest pain  Answer Assessment - Initial Assessment Questions 1. LOCATION: "Where does it hurt?"        to the R arm 2. RADIATION: "Does the pain go anywhere else?" (e.g., into neck, jaw, arms, back)     Radiates into arm, neck 3. ONSET: "When did the chest pain begin?" (Minutes, hours or days)      3 days 4. PATTERN "Does the pain come and go, or has it been constant since it started?"  "Does it get worse with exertion     Constant, worse with exertion 5. DURATION: "How long does it last" (e.g., seconds, minutes, hours)     hours 6. SEVERITY: "How bad is the pain?"  (e.g., Scale 1-10; mild, moderate, or severe)    - MILD (1-3): doesn't interfere with normal activities     - MODERATE (4-7): interferes with normal activities or awakens from sleep    - SEVERE (8-10): excruciating pain, unable to do any normal activities       severe 7. CARDIAC RISK FACTORS: "Do you have any history of heart problems or risk factors for heart disease?" (e.g., angina, prior heart attack; diabetes, high blood pressure, high cholesterol, smoker, or strong family history of heart disease)     Diabetes, high cholesterol 8. PULMONARY RISK FACTORS: "Do you have any history of lung disease?"  (e.g., blood clots in lung, asthma, emphysema, birth control pills)     no 9. CAUSE: "What do you think is causing the chest pain?"     Not sure 10. OTHER SYMPTOMS: "Do you have any other symptoms?" (e.g., dizziness, nausea, vomiting, sweating, fever, difficulty breathing, cough)       no 11. PREGNANCY: "Is there any chance you are pregnant?" "When was your last menstrual period?"       [name]  Protocols used: Chest Pain-A-AH

## 2021-09-29 NOTE — Telephone Encounter (Signed)
Patient's daughter is calling- patient is with her- patient complaints of R sided chest pain for 3 days- with radiation into the neck and arm. Patient states the pain is severe and gets worse with movement.  Due to severity of pain advised ED for evaluation.

## 2021-09-30 ENCOUNTER — Emergency Department (HOSPITAL_COMMUNITY)
Admission: EM | Admit: 2021-09-30 | Discharge: 2021-10-01 | Disposition: A | Discharge status: Home / Self Care | Payer: Medicaid Other | Attending: Emergency Medicine | Admitting: Emergency Medicine

## 2021-09-30 DIAGNOSIS — I2510 Atherosclerotic heart disease of native coronary artery without angina pectoris: Secondary | ICD-10-CM | POA: Insufficient documentation

## 2021-09-30 DIAGNOSIS — E119 Type 2 diabetes mellitus without complications: Secondary | ICD-10-CM | POA: Insufficient documentation

## 2021-09-30 DIAGNOSIS — I10 Essential (primary) hypertension: Secondary | ICD-10-CM | POA: Insufficient documentation

## 2021-09-30 DIAGNOSIS — Z79899 Other long term (current) drug therapy: Secondary | ICD-10-CM | POA: Diagnosis not present

## 2021-09-30 DIAGNOSIS — Z87891 Personal history of nicotine dependence: Secondary | ICD-10-CM | POA: Insufficient documentation

## 2021-09-30 DIAGNOSIS — M25511 Pain in right shoulder: Secondary | ICD-10-CM | POA: Insufficient documentation

## 2021-10-01 ENCOUNTER — Other Ambulatory Visit: Payer: Self-pay

## 2021-10-01 ENCOUNTER — Telehealth: Payer: Self-pay | Admitting: Surgery

## 2021-10-01 ENCOUNTER — Emergency Department (HOSPITAL_COMMUNITY): Payer: Medicaid Other

## 2021-10-01 ENCOUNTER — Telehealth: Payer: Self-pay | Admitting: *Deleted

## 2021-10-01 MED ORDER — TRAMADOL HCL 50 MG PO TABS: 50.0000 mg | ORAL_TABLET | Freq: Four times a day (QID) | ORAL | 0 refills | Status: AC | PRN

## 2021-10-01 MED ORDER — LIDOCAINE 5 % EX PTCH
1.0000 | MEDICATED_PATCH | CUTANEOUS | Status: DC
  Administered 2021-10-01: 1 via TRANSDERMAL
  Filled 2021-10-01: qty 1

## 2021-10-01 NOTE — Telephone Encounter (Signed)
ED RNCM received a call from patient's daughter concerning prescription for Toradol that has not been received at [facility] on [address] or [address], CM will send message to EDP on duty to follow up.

## 2021-10-01 NOTE — ED Provider Notes (Signed)
Emergency Medicine Provider Triage Evaluation Note  John Hayden , a 70 y.o. male  was evaluated in triage.  Pt complains of 4 days of persistent R shoulder pain. Pain aggravated with palpation and movement of the RUE. Improved with tylenol. Denies hx of trauma. Was advised to come to the ED by his PCP for evaluation.  Review of Systems  Positive: R shoulder pain Negative: Numbness   Physical Exam  BP (!) 166/112 (BP Location: Left Arm)   Pulse 90   Temp 98.8 F (37.1 C) (Oral)   Resp 18   Ht 5\' 4"  (1.626 m)   Wt 81.6 kg   SpO2 100%   BMI 30.90 kg/m  Gen:   Awake, no distress   Resp:  Normal effort  MSK:   Moves extremities without difficulty  Other:  TTP to posterior R shoulder. Preserved ROM of the RUE. No crepitus or deformity.  Medical Decision Making  Medically screening exam initiated at 12:42 AM.  Appropriate orders placed.  John Hayden was informed that the remainder of the evaluation will be completed by another provider, this initial triage assessment does not replace that evaluation, and the importance of remaining in the ED until their evaluation is complete.  RUE pain - pending Jessy Oto, PA-C 10/01/21 0043    10/03/21, MD 10/01/21 [phone]

## 2021-10-01 NOTE — ED Triage Notes (Signed)
Pt c/o right sided shoulder pain x 4 days denies injury. Took tylenol PTA around 8pm with some relief.

## 2021-10-01 NOTE — Telephone Encounter (Signed)
Pt daughter called regarding pharmacy not receiving Rx.  RNCM called in Rx as written.

## 2021-10-01 NOTE — ED Provider Notes (Signed)
[facility] EMERGENCY DEPARTMENT Provider Note   CSN: [id number] Arrival date & time: 09/30/21  2118     History Chief Complaint  Patient presents with   Shoulder Pain    John Hayden is a 70 y.o. male.  Patient with complaint of right shoulder pain top of the shoulder and to the back of the shoulder for 4 days.  No fall or injury.  No numbness or weakness to the hand.  Patient denies any previous problem with the shoulder.  Patient does not have orthopedic doctor.  Patient has a history of coronary artery disease hypertension and diabetes.  Patient denies any chest pain or shortness of breath.  Pain is made worse by moving the shoulder.      Past Medical History:  Diagnosis Date   Chest pain    echo 4/13: EF 50-55%, mild AI;  Myoview 4/13: EF 59%, no ischemia   Diabetes mellitus    borderline diabetic controlled by diet    GERD (gastroesophageal reflux disease)    Hypertension    Mild aortic insufficiency    Thrombocytopenia (HCC)     Patient Active Problem List   Diagnosis Date Noted   T2DM (type 2 diabetes mellitus) (HCC) 06/30/2017   Hypertension 03/30/2012   Mild AI (aortic insufficiency) 03/30/2012   Thrombocytopenia (HCC) 03/30/2012   Chest pain 03/14/2012    Past Surgical History:  Procedure Laterality Date   hernia     inguinal and abdominal   HERNIA REPAIR     LEFT HEART CATHETERIZATION WITH CORONARY ANGIOGRAM [name] 03/14/2012   Procedure: LEFT HEART CATHETERIZATION WITH CORONARY ANGIOGRAM;  Surgeon: Peter M Swaziland, MD;  Location: [facility] CATH LAB;  Service: Cardiovascular;  Laterality: [name];       Family History  Problem Relation Age of Onset   Diabetes Mother     Social History   Tobacco Use   Smoking status: Former   Smokeless tobacco: Never   Tobacco comments:    1997  Vaping Use   Vaping Use: Never used  Substance Use Topics   Alcohol use: No   Drug use: No    Home Medications Prior to Admission medications    Medication Sig Start Date End Date Taking? Authorizing Provider  traMADol (ULTRAM) 50 MG tablet Take 1 tablet (50 mg total) by mouth every 6 (six) hours as needed. 10/01/21  Yes Vanetta Mulders, MD  amLODipine (NORVASC) 10 MG tablet Take 1 tablet (10 mg total) by mouth daily. 09/17/21   Claiborne Rigg, NP  levothyroxine (SYNTHROID) 25 MCG tablet Take 1 tablet (25 mcg total) by mouth daily before breakfast. 09/17/21   Claiborne Rigg, NP  lisinopril (ZESTRIL) 20 MG tablet Take 1 tablet (20 mg total) by mouth daily. 09/17/21 12/16/21  Claiborne Rigg, NP  metFORMIN (GLUCOPHAGE) 500 MG tablet Take 1 tablet (500 mg total) by mouth 2 (two) times daily with a meal. 09/17/21   Claiborne Rigg, NP  omeprazole (PRILOSEC) 40 MG capsule Take 1 capsule (40 mg total) by mouth daily. Please fill as a 90 day supply 09/17/21 12/16/21  Claiborne Rigg, NP  pravastatin (PRAVACHOL) 40 MG tablet Take 1 tablet (40 mg total) by mouth daily. Please fill as a 90 day supply 09/17/21   Claiborne Rigg, NP  PRESCRIPTION MEDICATION Take 4 mg by mouth daily. 1 tablet once daily. ( cardox 4 mg. ( for prostate)    [provider]  PRESCRIPTION MEDICATION Take 1 tablet by mouth  daily. Reported on 03/13/2016    [provider]    Allergies    Patient has no active allergies.  Review of Systems   Review of Systems  Constitutional:  Negative for chills and fever.  HENT:  Negative for ear pain and sore throat.   Eyes:  Negative for pain and visual disturbance.  Respiratory:  Negative for cough and shortness of breath.   Cardiovascular:  Negative for chest pain and palpitations.  Gastrointestinal:  Negative for abdominal pain and vomiting.  Genitourinary:  Negative for dysuria and hematuria.  Musculoskeletal:  Negative for arthralgias and back pain.  Skin:  Negative for color change and rash.  Neurological:  Negative for seizures and syncope.  All other systems reviewed and are negative.  Physical  Exam Updated Vital Signs BP (!) 157/91 (BP Location: Left Arm)   Pulse 73   Temp 98 F (36.7 C) (Oral)   Resp 18   Ht 1.626 m (5\' 4" )   Wt 81.6 kg   SpO2 100%   BMI 30.90 kg/m   Physical Exam Vitals and nursing note reviewed.  Constitutional:      Appearance: Normal appearance. He is well-developed.  HENT:     Head: Normocephalic and atraumatic.  Eyes:     Extraocular Movements: Extraocular movements intact.     Conjunctiva/sclera: Conjunctivae normal.     Pupils: Pupils are equal, round, and reactive to light.  Cardiovascular:     Rate and Rhythm: Normal rate and regular rhythm.     Heart sounds: No murmur heard. Pulmonary:     Effort: Pulmonary effort is normal. No respiratory distress.     Breath sounds: Normal breath sounds.  Abdominal:     Palpations: Abdomen is soft.     Tenderness: There is no abdominal tenderness.  Musculoskeletal:        General: No deformity or signs of injury.     Cervical back: Normal range of motion and neck supple.     Comments: Pain with range of motion at the right shoulder.  No pain with range of motion at the elbow or wrist.  Good strength to the hand sensation intact radial pulse 1+ cap refill less than 2 no swelling of the arm.  Skin:    General: Skin is warm and dry.     Capillary Refill: Capillary refill takes less than 2 seconds.  Neurological:     General: No focal deficit present.     Mental Status: He is alert and oriented to person, place, and time.    ED Results / Procedures / Treatments   Labs (all labs ordered are listed, but only abnormal results are displayed) Labs Reviewed - No data to display  EKG None  Radiology DG Shoulder Right  Result Date: 10/01/2021 CLINICAL DATA:  Shoulder pain. EXAM: RIGHT SHOULDER - 2+ VIEW COMPARISON:  None. FINDINGS: There is no evidence of fracture or dislocation. There is no evidence of arthropathy or other focal bone abnormality. Soft tissues are unremarkable. IMPRESSION:  Negative. Electronically Signed   By: 10/03/2021 M.D.   On: 10/01/2021 01:35    Procedures Procedures   Medications Ordered in ED Medications  lidocaine (LIDODERM) 5 % 1 patch (has no administration in time range)    ED Course  I have reviewed the triage vital signs and the nursing notes.  Pertinent labs & imaging results that were available during my care of the patient were reviewed by me and considered in my medical decision  making (see chart for details).    MDM Rules/Calculators/A&P                           Suggestive of right shoulder pain may be rotator cuff.  Certainly painful with range of motion.  Seems to be musculoskeletal.  X-ray without any acute findings.  Will treat with shoulder immobilizer and follow-up with orthopedics.  And tramadol for pain.  Final Clinical Impression(s) / ED Diagnoses Final diagnoses:  Acute pain of right shoulder    Rx / DC Orders ED Discharge Orders          Ordered    traMADol (ULTRAM) 50 MG tablet  Every 6 hours PRN        10/01/21 1937             Vanetta Mulders, MD 10/01/21 [phone]

## 2021-10-01 NOTE — ED Notes (Signed)
Discharge instructions reviewed with patient and patient's daughter (via phone). Patient and daughter verbalized understanding of instructions. Follow-up care and medications were reviewed. Patient ambulatory with steady gait. VSS upon discharge.

## 2021-10-01 NOTE — Discharge Instructions (Signed)
Wear the shoulder immobilizer for comfort.  Take the tramadol as needed for pain.  Make an appointment to follow-up with orthopedics.  X-rays of the right shoulder without any bony abnormalities.  May have rotator cuff injury.

## 2021-12-17 ENCOUNTER — Other Ambulatory Visit: Payer: Self-pay

## 2021-12-18 ENCOUNTER — Other Ambulatory Visit: Payer: Self-pay

## 2021-12-19 ENCOUNTER — Other Ambulatory Visit: Payer: Self-pay

## 2022-04-14 IMAGING — CR DG SHOULDER 2+V*R*
3 series · 3 of 3 positions shown · non-contrast
Comparison: None.

CLINICAL DATA: Shoulder pain.

EXAM:
RIGHT SHOULDER - 2+ VIEW

[shoulder grashey]
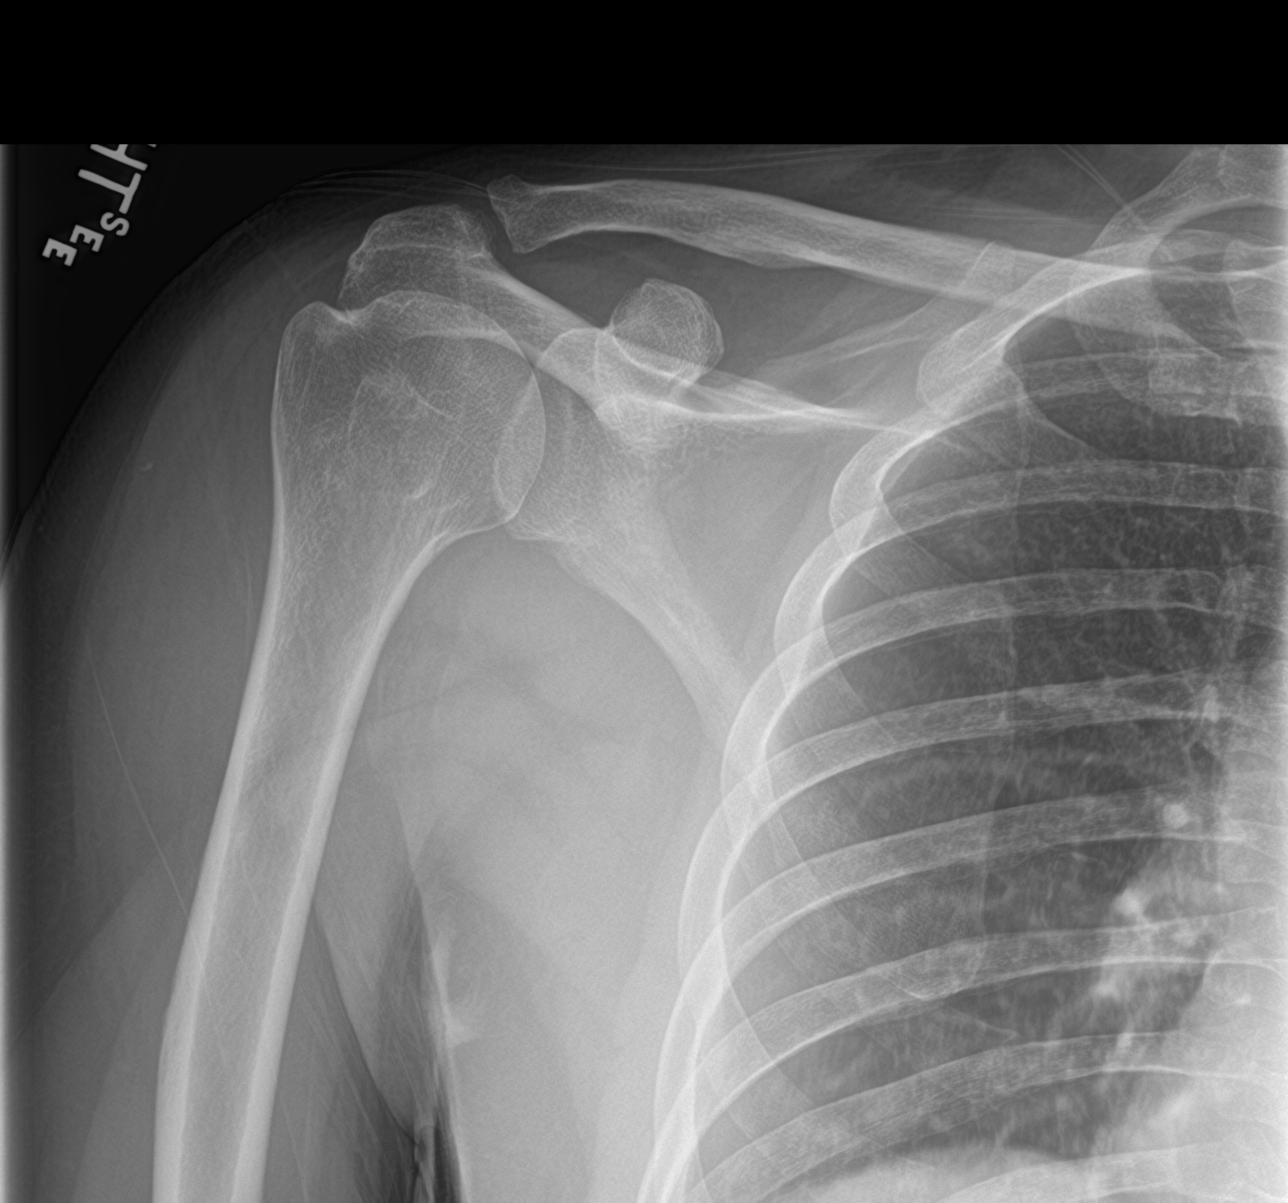

[shoulder y view]
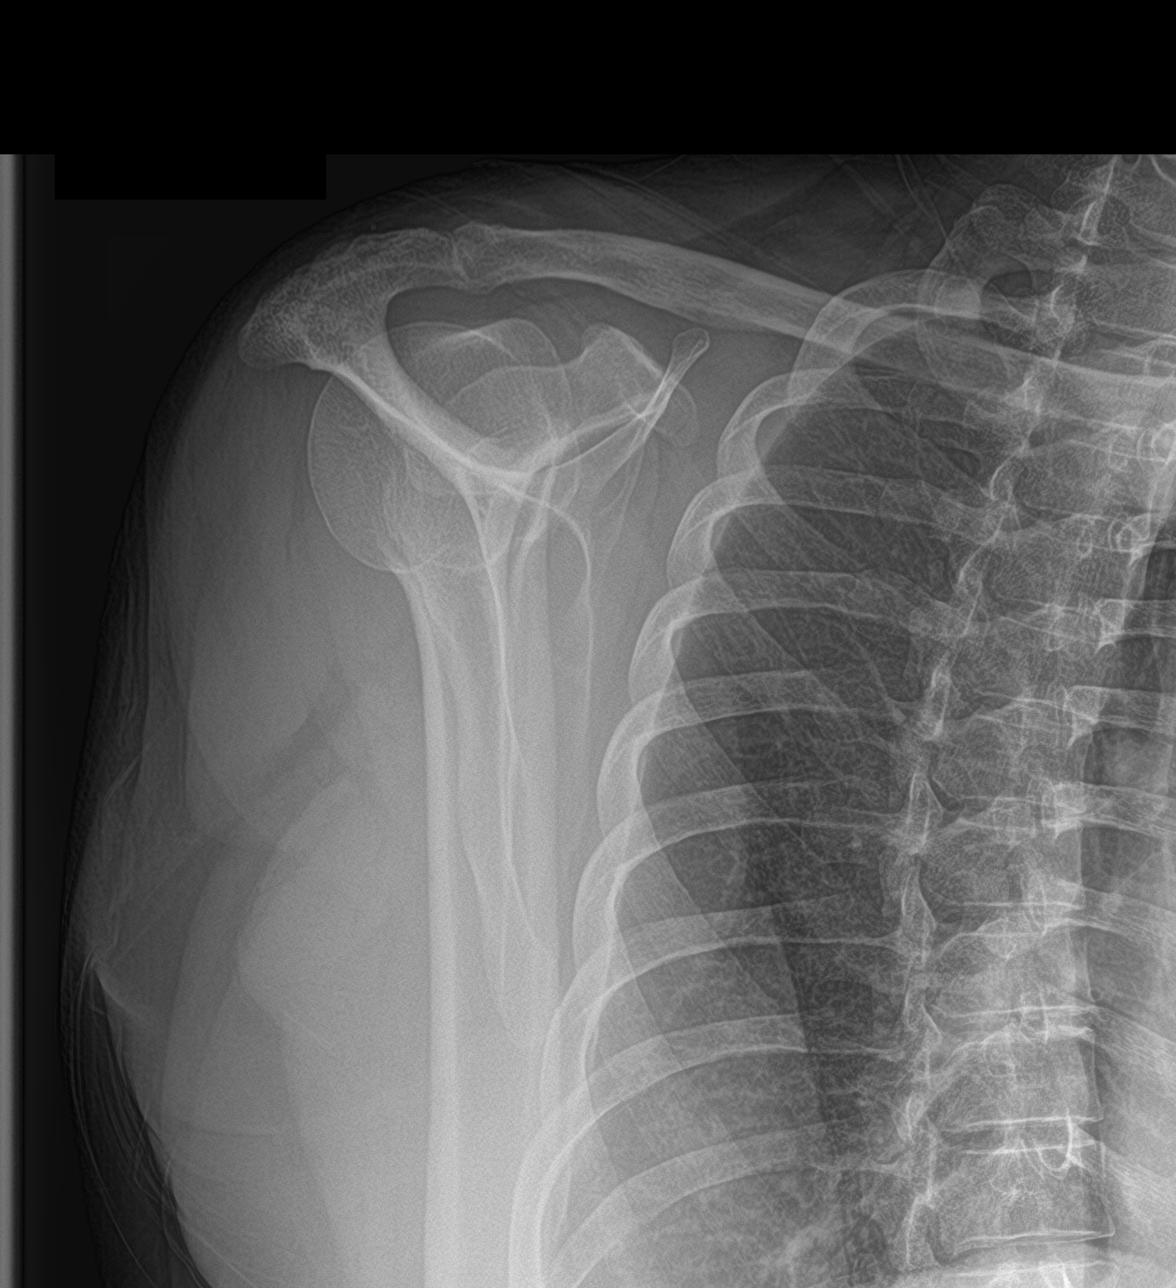

[shoulder ap neutral]
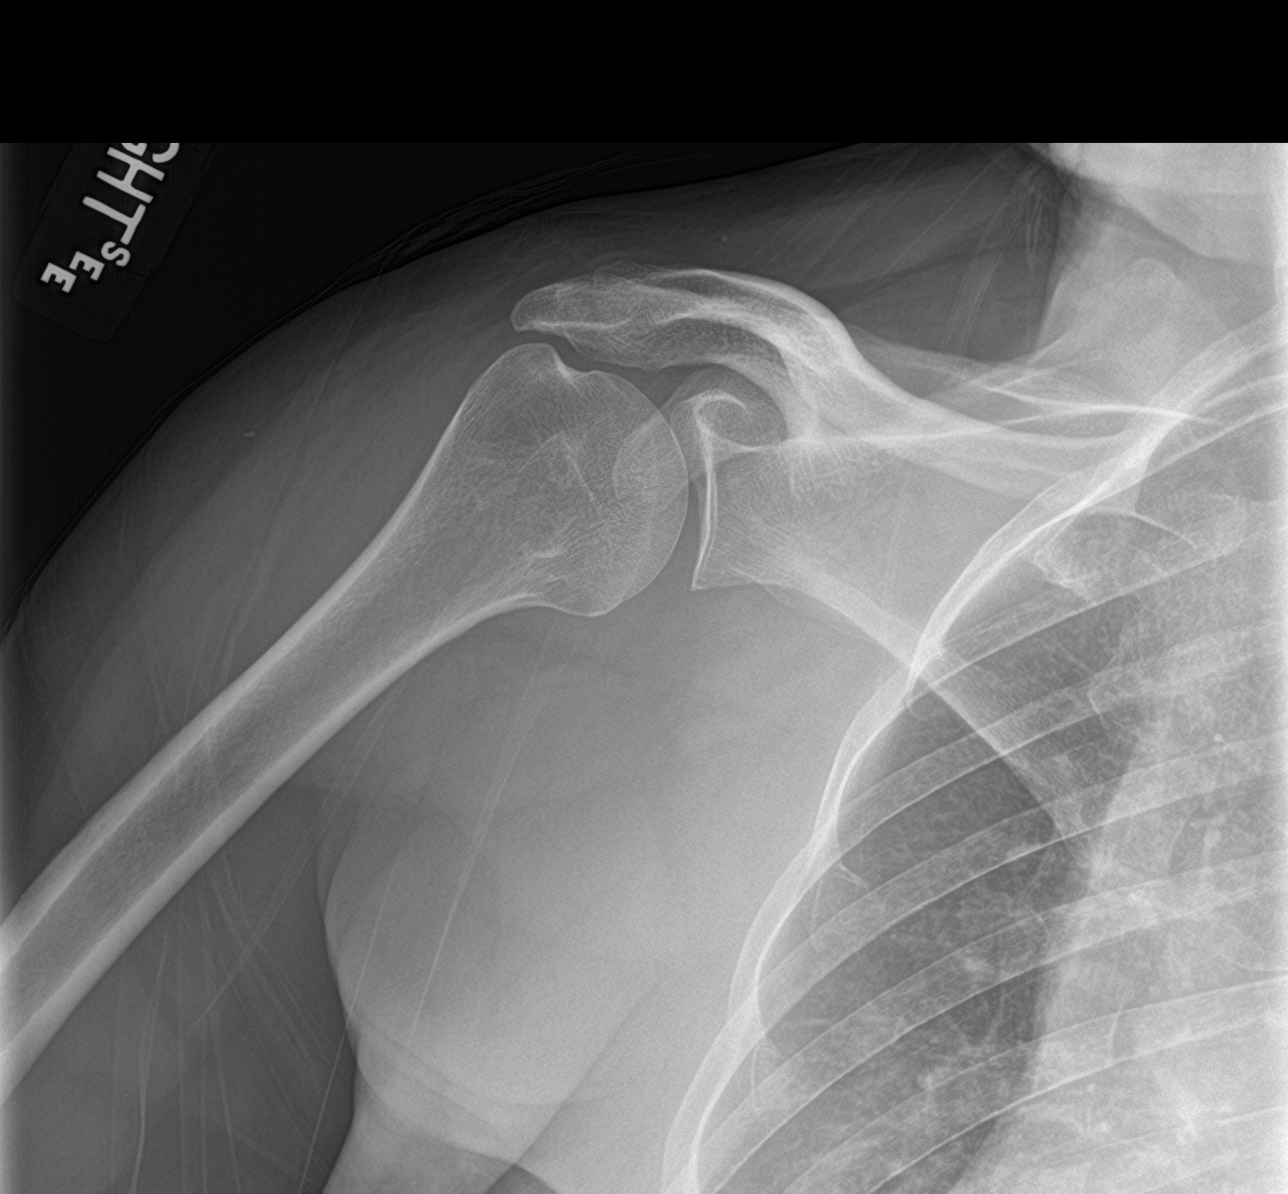

[3 of 3 positions shown; findings below may reference images not displayed]

FINDINGS: There is no evidence of fracture or dislocation. There is no
evidence of arthropathy or other focal bone abnormality. Soft
tissues are unremarkable.
IMPRESSION: Negative.

## 2022-04-15 ENCOUNTER — Telehealth: Payer: Self-pay | Admitting: Student-PharmD

## 2022-04-15 NOTE — Telephone Encounter (Signed)
Patient's daughter returned call. Is not able to talk long on the phone but states her father is traveling and will be back at the end of the month. Offered to make follow up appts with the clinical pharmacist and his PCP for when he returns. She says she will wait until he is back so she can schedule them with him. Provided phone number of [facility] which she wrote down and encouraged her to call to schedule these appointments upon his return.  ?

## 2022-04-15 NOTE — Telephone Encounter (Signed)
Patient appearing on report for [employer] - Hypertension Control report due to last documented ambulatory blood pressure of 188/97 on 09/17/21. Next appointment with PCP is not currently scheduled. Patient was supposed to follow up with CPP 4 weeks after last visit and with PCP 3 months after visit but neither were completed.  ? ?Outreached patient to discuss hypertension control and medication management.  ? ?Current antihypertensives: amlodipine 10 mg daily, lisinopril 20 mg daily ? ?The patient is likely out of medications given prescriptions are now expired and needs follow up scheduled with PCP.  ? ?Unable to reach patient/daughter. LVM requesting call back.  ? ?John Hayden, PharmD ?PGY2 Ambulatory Care Pharmacy Resident ?04/15/2022 2:37 PM ? ? ?

## 2022-08-27 ENCOUNTER — Other Ambulatory Visit: Payer: Self-pay

## 2022-08-28 ENCOUNTER — Other Ambulatory Visit: Payer: Self-pay

## 2022-09-17 ENCOUNTER — Other Ambulatory Visit: Payer: Self-pay

## 2022-09-21 ENCOUNTER — Other Ambulatory Visit: Payer: Self-pay

## 2023-03-29 ENCOUNTER — Ambulatory Visit: Payer: Self-pay

## 2023-03-29 NOTE — Telephone Encounter (Signed)
  Chief Complaint: Abdominal pain 7-8/10 Symptoms: pain Frequency: after breakfast today Pertinent Negatives: Patient denies chest pain Disposition: ED /[] Urgent Care (no appt availability in office) / Appointment(In office/virtual)/  [place]/ Home Care/ Refused Recommended Disposition /[] [facility]/  Follow-up with PCP Additional Notes: Spoke with pt's daughter. She states that pt has abdominal pain that started this morning after breakfast. Pain is intermittent.    Reason for Disposition  [1] SEVERE pain AND [2] age > 60 years  Answer Assessment - Initial Assessment Questions 1. LOCATION: "Where does it hurt?"      Middle 2. RADIATION: "Does the pain shoot anywhere else?" (e.g., chest, back)     no 3. ONSET: "When did the pain begin?" (Minutes, hours or days ago)      After breakfast today 4. SUDDEN: "Gradual or sudden onset?"     Sudden 5. PATTERN "Does the pain come and go, or is it constant?"    - If it comes and goes: "How long does it last?" "Do you have pain now?"     (Note: Comes and goes means the pain is intermittent. It goes away completely between bouts.)    - If constant: "Is it getting better, staying the same, or getting worse?"      (Note: Constant means the pain never goes away completely; most serious pain is constant and gets worse.)      Comes and goes 6. SEVERITY: "How bad is the pain?"  (e.g., Scale 1-10; mild, moderate, or severe)    - MILD (1-3): Doesn't interfere with normal activities, abdomen soft and not tender to touch.     - MODERATE (4-7): Interferes with normal activities or awakens from sleep, abdomen tender to touch.     - SEVERE (8-10): Excruciating pain, doubled over, unable to do any normal activities.       7-8/10 7. RECURRENT SYMPTOM: "Have you ever had this type of stomach pain before?" If Yes, ask: "When was the last time?" and "What happened that time?"      sometimes 8. CAUSE: "What do you think is  causing the stomach pain?"     no 9. RELIEVING/AGGRAVATING FACTORS: "What makes it better or worse?" (e.g., antacids, bending or twisting motion, bowel movement)     no 10. OTHER SYMPTOMS: "Do you have any other symptoms?" (e.g., back pain, diarrhea, fever, urination pain, vomiting)       no  Protocols used: Abdominal Pain - Male-A-AH

## 2023-03-29 NOTE — Telephone Encounter (Signed)
Patient has PCP on 04/14/2023

## 2023-04-14 ENCOUNTER — Encounter: Payer: Self-pay | Admitting: Physician Assistant

## 2023-04-14 ENCOUNTER — Other Ambulatory Visit: Payer: Self-pay

## 2023-04-14 ENCOUNTER — Ambulatory Visit: Payer: Medicaid Other | Attending: Physician Assistant | Admitting: Physician Assistant

## 2023-04-14 ENCOUNTER — Other Ambulatory Visit: Payer: Self-pay | Admitting: Pharmacist

## 2023-04-14 VITALS — BP 119/79 | HR 64 | Wt 173.0 lb

## 2023-04-14 DIAGNOSIS — E782 Mixed hyperlipidemia: Secondary | ICD-10-CM | POA: Diagnosis not present

## 2023-04-14 DIAGNOSIS — E1165 Type 2 diabetes mellitus with hyperglycemia: Secondary | ICD-10-CM

## 2023-04-14 DIAGNOSIS — I10 Essential (primary) hypertension: Secondary | ICD-10-CM

## 2023-04-14 DIAGNOSIS — Z7984 Long term (current) use of oral hypoglycemic drugs: Secondary | ICD-10-CM

## 2023-04-14 DIAGNOSIS — Z603 Acculturation difficulty: Secondary | ICD-10-CM

## 2023-04-14 DIAGNOSIS — K219 Gastro-esophageal reflux disease without esophagitis: Secondary | ICD-10-CM | POA: Diagnosis not present

## 2023-04-14 DIAGNOSIS — Z758 Other problems related to medical facilities and other health care: Secondary | ICD-10-CM

## 2023-04-14 DIAGNOSIS — I2510 Atherosclerotic heart disease of native coronary artery without angina pectoris: Secondary | ICD-10-CM

## 2023-04-14 DIAGNOSIS — R7989 Other specified abnormal findings of blood chemistry: Secondary | ICD-10-CM

## 2023-04-14 LAB — POCT GLYCOSYLATED HEMOGLOBIN (HGB A1C): HbA1c, POC (controlled diabetic range): 7.5 % — AB (ref 0.0–7.0)

## 2023-04-14 LAB — GLUCOSE, POCT (MANUAL RESULT ENTRY): POC Glucose: 222 mg/dl — AB (ref 70–99)

## 2023-04-14 MED ORDER — AMLODIPINE BESYLATE 10 MG PO TABS
10.0000 mg | ORAL_TABLET | Freq: Every day | ORAL | 1 refills | Status: AC
Start: 2023-04-14 — End: ?
  Filled 2023-04-14 (×2): qty 90, 90d supply, fill #0
  Filled 2023-08-24: qty 90, 90d supply, fill #1

## 2023-04-14 MED ORDER — PRAVASTATIN SODIUM 40 MG PO TABS
40.0000 mg | ORAL_TABLET | Freq: Every day | ORAL | 3 refills | Status: AC
Start: 2023-04-14 — End: ?
  Filled 2023-04-14: qty 30, 30d supply, fill #0
  Filled 2023-08-24: qty 30, 30d supply, fill #1
  Filled 2023-08-30: qty 60, 60d supply, fill #2

## 2023-04-14 MED ORDER — CLOPIDOGREL BISULFATE 75 MG PO TABS
75.0000 mg | ORAL_TABLET | Freq: Every day | ORAL | 1 refills | Status: AC
  Filled 2023-04-14 (×2): qty 90, 90d supply, fill #0
  Filled 2023-08-24: qty 90, 90d supply, fill #1

## 2023-04-14 MED ORDER — BISOPROLOL FUMARATE 5 MG PO TABS
5.0000 mg | ORAL_TABLET | Freq: Every day | ORAL | 1 refills | Status: AC
Start: 2023-04-14 — End: ?
  Filled 2023-04-14 (×2): qty 90, 90d supply, fill #0
  Filled 2023-08-24: qty 90, 90d supply, fill #1

## 2023-04-14 MED ORDER — TRUE METRIX BLOOD GLUCOSE TEST VI STRP
ORAL_STRIP | 2 refills | Status: AC
Start: 2023-04-14 — End: ?
  Filled 2023-04-14: qty 100, 100d supply, fill #0
  Filled 2023-08-24: qty 100, 100d supply, fill #1

## 2023-04-14 MED ORDER — LISINOPRIL-HYDROCHLOROTHIAZIDE 20-12.5 MG PO TABS
1.0000 | ORAL_TABLET | Freq: Every day | ORAL | 3 refills | Status: AC
Start: 2023-04-14 — End: ?
  Filled 2023-04-14 (×2): qty 90, 90d supply, fill #0
  Filled 2023-08-24: qty 90, 90d supply, fill #1

## 2023-04-14 MED ORDER — PANTOPRAZOLE SODIUM 40 MG PO TBEC
40.0000 mg | DELAYED_RELEASE_TABLET | Freq: Every day | ORAL | 3 refills | Status: AC
Start: 2023-04-14 — End: ?
  Filled 2023-04-14: qty 90, 90d supply, fill #0
  Filled 2023-08-24: qty 30, 30d supply, fill #1

## 2023-04-14 MED ORDER — TRUEPLUS LANCETS 28G MISC
2 refills | Status: AC
  Filled 2023-04-14: qty 100, 100d supply, fill #0
  Filled 2023-08-24: qty 100, 100d supply, fill #1

## 2023-04-14 MED ORDER — TRUE METRIX METER W/DEVICE KIT
PACK | 0 refills | Status: AC
  Filled 2023-04-14: qty 1, 1d supply, fill #0

## 2023-04-14 MED ORDER — METFORMIN HCL 500 MG PO TABS
1000.0000 mg | ORAL_TABLET | Freq: Two times a day (BID) | ORAL | 1 refills | Status: AC
Start: 2023-04-14 — End: ?
  Filled 2023-04-14: qty 360, 90d supply, fill #0
  Filled 2023-08-24: qty 360, 90d supply, fill #1

## 2023-04-14 NOTE — Progress Notes (Signed)
Patient ID: John Hayden, male   DOB: [birth year], 72 y.o.   MRN: [id number]   John Hayden, is a 72 y.o. male  EPP:[id number]  SAY:[id number]  DOB - 1951/03/08  Chief Complaint  Patient presents with   Medication Refill   Diabetes       Subjective:   John Hayden is a 72 y.o. male here today for me RF.  He has been back in [place] and receiving care there.  He was last there about 3 months ago and was having "palpitations" and was started on plavix and bisoprolol.  Omeprazole was changed to pantoprazole.  HCT was added to lisinopril.  No new issues or concerns today.  No CP/SOB.  No palpitations in 3-4 months.  No problems updated.  ALLERGIES: No Active Allergies  PAST MEDICAL HISTORY: Past Medical History:  Diagnosis Date   Chest pain    echo 4/13: EF 50-55%, mild AI;  Myoview 4/13: EF 59%, no ischemia   Diabetes mellitus    borderline diabetic controlled by diet    GERD (gastroesophageal reflux disease)    Hypertension    Mild aortic insufficiency    Thrombocytopenia (HCC)     MEDICATIONS AT HOME: Prior to Admission medications   Medication Sig Start Date End Date Taking? Authorizing Provider  bisoprolol (ZEBETA) 5 MG tablet Take 1 tablet (5 mg total) by mouth daily. 04/14/23  Yes Anders Simmonds, PA-C  clopidogrel (PLAVIX) 75 MG tablet Take 1 tablet (75 mg total) by mouth daily. 04/14/23  Yes Georgian Co M, PA-C  pantoprazole (PROTONIX) 40 MG tablet Take 1 tablet (40 mg total) by mouth daily. 04/14/23  Yes Georgian Co M, PA-C  amLODipine (NORVASC) 10 MG tablet Take 1 tablet (10 mg total) by mouth daily. 04/14/23   Anders Simmonds, PA-C  levothyroxine (SYNTHROID) 25 MCG tablet Take 1 tablet (25 mcg total) by mouth daily before breakfast. 09/17/21   Claiborne Rigg, NP  metFORMIN (GLUCOPHAGE) 500 MG tablet Take 2 tablets (1,000 mg total) by mouth 2 (two) times daily with a meal. 04/14/23   Mohamedamin Nifong, Marzella Schlein, PA-C  pravastatin (PRAVACHOL) 40 MG tablet Take 1 tablet  (40 mg total) by mouth daily. Please fill as a 90 day supply 04/14/23   Anders Simmonds, PA-C  PRESCRIPTION MEDICATION Take 4 mg by mouth daily. 1 tablet once daily. ( cardox 4 mg. ( for prostate)    [provider]  PRESCRIPTION MEDICATION Take 1 tablet by mouth daily. Reported on 03/13/2016    [provider]  traMADol (ULTRAM) 50 MG tablet Take 1 tablet (50 mg total) by mouth every 6 (six) hours as needed. 10/01/21   Vanetta Mulders, MD    ROS: Neg HEENT Neg resp Neg cardiac Neg GI Neg GU Neg MS Neg psych Neg neuro  Objective:   Vitals:   04/14/23 0920 04/14/23 0945  BP: (!) 164/83 119/79  Pulse: 64   SpO2: 100%   Weight: 173 lb (78.5 kg)    Exam General appearance : Awake, alert, not in any distress. Speech Clear. Not toxic looking HEENT: Atraumatic and Normocephalic Neck: Supple, no JVD. No cervical lymphadenopathy.  Chest: Good air entry bilaterally, CTAB.  No rales/rhonchi/wheezing CVS: S1 S2 regular, no murmurs.  Extremities: B/L Lower Ext shows no edema, both legs are warm to touch Neurology: Awake alert, and oriented X 3, CN II-XII intact, Non focal Skin: No Rash  Data Review Lab Results  Component Value Date   HGBA1C 7.5 (A) 04/14/2023  HGBA1C 7.6 (A) 09/17/2021   HGBA1C 7.1 (A) 10/19/2018      04/14/2023    9:57 AM 09/17/2021    9:02 AM 10/19/2018    4:21 PM  Depression screen PHQ 2/9  Decreased Interest 0 1 0  Down, Depressed, Hopeless 0 0 0  PHQ - 2 Score 0 1 0  Altered sleeping 0 1 1  Tired, decreased energy 0 0 1  Change in appetite 1 1 1   Feeling bad or failure about yourself  0 0 0  Trouble concentrating 1 3 1   Moving slowly or fidgety/restless 0 0 0  Suicidal thoughts 0 0 0  PHQ-9 Score 2 6 4   Difficult doing work/chores  Not difficult at all     Assessment & Plan   1. Type 2 diabetes mellitus with hyperglycemia, unspecified whether long term insulin use (HCC) Increase dose metformin - Glucose (CBG) - HgB A1c -  Comprehensive metabolic panel - metFORMIN (GLUCOPHAGE) 500 MG tablet; Take 2 tablets (1,000 mg total) by mouth 2 (two) times daily with a meal.  Dispense: 360 tablet; Refill: 1  2. GERD without esophagitis - CBC with Differential/Platelet - pantoprazole (PROTONIX) 40 MG tablet; Take 1 tablet (40 mg total) by mouth daily.  Dispense: 30 tablet; Refill: 3  3. Mixed hyperlipidemia - Lipid panel - CBC with Differential/Platelet - pravastatin (PRAVACHOL) 40 MG tablet; Take 1 tablet (40 mg total) by mouth daily. Please fill as a 90 day supply  Dispense: 90 tablet; Refill: 3  4. Primary hypertension - amLODipine (NORVASC) 10 MG tablet; Take 1 tablet (10 mg total) by mouth daily.  Dispense: 90 tablet; Refill: 1 - bisoprolol (ZEBETA) 5 MG tablet; Take 1 tablet (5 mg total) by mouth daily.  Dispense: 90 tablet; Refill: 1 -lisinopril/HCT 20/12.5 one daily  5. Elevated TSH Will send Rx if he needs to resume this - Thyroid Panel With TSH - CBC with Differential/Platelet  6. CAD, multiple vessel - clopidogrel (PLAVIX) 75 MG tablet; Take 1 tablet (75 mg total) by mouth daily.  Dispense: 90 tablet; Refill: 1 - bisoprolol (ZEBETA) 5 MG tablet; Take 1 tablet (5 mg total) by mouth daily.  Dispense: 90 tablet; Refill: 1  7. Language barrier [employer] interpreters used and additional time performing visit was required.   Return in about 3 months (around 07/15/2023) for PCP for chronic conditions.  The patient was given clear instructions to go to ER or return to medical center if symptoms don't improve, worsen or new problems develop. The patient verbalized understanding. The patient was told to call to get lab results if they haven't heard anything in the next week.      Georgian Co, PA-C [facility] [place], [place] [phone]   04/14/2023, 9:46 AM

## 2023-04-15 ENCOUNTER — Other Ambulatory Visit: Payer: Self-pay

## 2023-04-15 ENCOUNTER — Other Ambulatory Visit: Payer: Self-pay | Admitting: Physician Assistant

## 2023-04-15 DIAGNOSIS — R7989 Other specified abnormal findings of blood chemistry: Secondary | ICD-10-CM

## 2023-04-15 LAB — THYROID PANEL WITH TSH
Free Thyroxine Index: 1.2 (ref 1.2–4.9)
T3 Uptake Ratio: 27 % (ref 24–39)
T4, Total: 4.6 ug/dL (ref 4.5–12.0)
TSH: 7.47 u[IU]/mL — ABNORMAL HIGH (ref 0.450–4.500)

## 2023-04-15 LAB — CBC WITH DIFFERENTIAL/PLATELET
Basophils Absolute: 0 10*3/uL (ref 0.0–0.2)
Basos: 0 %
EOS (ABSOLUTE): 0 10*3/uL (ref 0.0–0.4)
Eos: 1 %
Hematocrit: 39.3 % (ref 37.5–51.0)
Hemoglobin: 12.8 g/dL — ABNORMAL LOW (ref 13.0–17.7)
Immature Grans (Abs): 0 10*3/uL (ref 0.0–0.1)
Immature Granulocytes: 0 %
Lymphocytes Absolute: 2 10*3/uL (ref 0.7–3.1)
Lymphs: 36 %
MCH: 27 pg (ref 26.6–33.0)
MCHC: 32.6 g/dL (ref 31.5–35.7)
MCV: 83 fL (ref 79–97)
Monocytes Absolute: 0.5 10*3/uL (ref 0.1–0.9)
Monocytes: 9 %
Neutrophils Absolute: 3 10*3/uL (ref 1.4–7.0)
Neutrophils: 54 %
Platelets: 167 10*3/uL (ref 150–450)
RBC: 4.74 x10E6/uL (ref 4.14–5.80)
RDW: 13.2 % (ref 11.6–15.4)
WBC: 5.5 10*3/uL (ref 3.4–10.8)

## 2023-04-15 LAB — COMPREHENSIVE METABOLIC PANEL
ALT: 20 IU/L (ref 0–44)
AST: 20 IU/L (ref 0–40)
Albumin/Globulin Ratio: 1.5 (ref 1.2–2.2)
Albumin: 4.4 g/dL (ref 3.8–4.8)
Alkaline Phosphatase: 63 IU/L (ref 44–121)
BUN/Creatinine Ratio: 21 (ref 10–24)
BUN: 23 mg/dL (ref 8–27)
Bilirubin Total: 0.3 mg/dL (ref 0.0–1.2)
CO2: 21 mmol/L (ref 20–29)
Calcium: 9.9 mg/dL (ref 8.6–10.2)
Chloride: 99 mmol/L (ref 96–106)
Creatinine, Ser: 1.11 mg/dL (ref 0.76–1.27)
Globulin, Total: 2.9 g/dL (ref 1.5–4.5)
Glucose: 174 mg/dL — ABNORMAL HIGH (ref 70–99)
Potassium: 4.2 mmol/L (ref 3.5–5.2)
Sodium: 137 mmol/L (ref 134–144)
Total Protein: 7.3 g/dL (ref 6.0–8.5)
eGFR: 70 mL/min/{1.73_m2} (ref >59)

## 2023-04-15 LAB — LIPID PANEL
Chol/HDL Ratio: 4.4 ratio (ref 0.0–5.0)
Cholesterol, Total: 169 mg/dL (ref 100–199)
HDL: 38 mg/dL — ABNORMAL LOW (ref >39)
LDL Chol Calc (NIH): 102 mg/dL — ABNORMAL HIGH (ref 0–99)
Triglycerides: 167 mg/dL — ABNORMAL HIGH (ref 0–149)
VLDL Cholesterol Cal: 29 mg/dL (ref 5–40)

## 2023-04-15 MED ORDER — LEVOTHYROXINE SODIUM 25 MCG PO TABS
25.0000 ug | ORAL_TABLET | Freq: Every day | ORAL | 0 refills | Status: DC
Start: 2023-04-15 — End: 2023-08-24
  Filled 2023-04-27 (×2): qty 90, 90d supply, fill #0

## 2023-04-27 ENCOUNTER — Encounter: Payer: Medicaid Other | Admitting: Nurse Practitioner

## 2023-04-27 ENCOUNTER — Other Ambulatory Visit: Payer: Self-pay

## 2023-04-30 ENCOUNTER — Other Ambulatory Visit: Payer: Self-pay

## 2023-07-19 ENCOUNTER — Ambulatory Visit: Payer: Medicaid Other | Admitting: Nurse Practitioner

## 2023-08-24 ENCOUNTER — Other Ambulatory Visit: Payer: Self-pay

## 2023-08-24 ENCOUNTER — Other Ambulatory Visit: Payer: Self-pay | Admitting: Physician Assistant

## 2023-08-24 DIAGNOSIS — R7989 Other specified abnormal findings of blood chemistry: Secondary | ICD-10-CM

## 2023-08-25 ENCOUNTER — Other Ambulatory Visit: Payer: Self-pay

## 2023-08-25 MED ORDER — LEVOTHYROXINE SODIUM 25 MCG PO TABS
25.0000 ug | ORAL_TABLET | Freq: Every day | ORAL | 0 refills | Status: AC
Start: 2023-08-25 — End: ?
  Filled 2023-08-25: qty 30, 30d supply, fill #0

## 2023-08-25 NOTE — Telephone Encounter (Signed)
Requested medication (s) are due for refill today: yes  Requested medication (s) are on the active medication list: yes  Last refill:  04/15/23 #90 0 refills  Future visit scheduled: no  Notes to clinic:  no refills remain. Do you want to refill Rx?     Requested Prescriptions  Pending Prescriptions Disp Refills   levothyroxine (SYNTHROID) 25 MCG tablet 90 tablet 0    Sig: Take 1 tablet (25 mcg total) by mouth daily before breakfast.     Endocrinology:  Hypothyroid Agents Failed - 08/24/2023 12:50 PM      Failed - TSH in normal range and within 360 days    TSH  Date Value Ref Range Status  04/14/2023 7.470 (H) 0.450 - 4.500 uIU/mL Final         Passed - Valid encounter within last 12 months    Recent Outpatient Visits           4 months ago Type 2 diabetes mellitus with hyperglycemia, unspecified whether long term insulin use [facility])   [place] [facility] [place], [place], [place]   1 year ago Type 2 diabetes mellitus without complication, without long-term current use of insulin [facility])   [place] [facility] [place], [place] W, NP   4 years ago Controlled type 2 diabetes mellitus without complication, without long-term current use of insulin [facility])   [place] [facility] [place], [place] Stakes, NP

## 2023-08-26 ENCOUNTER — Other Ambulatory Visit: Payer: Self-pay

## 2023-08-30 ENCOUNTER — Other Ambulatory Visit: Payer: Self-pay
